# Patient Record
Sex: Male | Born: 1960 | Race: White | Hispanic: No | Marital: Married | Smoking: Current every day smoker
Health system: Southern US, Community
[De-identification: ages and names within clinical notes are randomized; demographics above are authoritative.]

## PROBLEM LIST (undated history)

## (undated) DIAGNOSIS — I10 Essential (primary) hypertension: Secondary | ICD-10-CM

## (undated) DIAGNOSIS — M51369 Other intervertebral disc degeneration, lumbar region without mention of lumbar back pain or lower extremity pain: Secondary | ICD-10-CM

## (undated) DIAGNOSIS — J439 Emphysema, unspecified: Secondary | ICD-10-CM

## (undated) DIAGNOSIS — G4730 Sleep apnea, unspecified: Secondary | ICD-10-CM

## (undated) DIAGNOSIS — F32A Depression, unspecified: Secondary | ICD-10-CM

## (undated) DIAGNOSIS — F329 Major depressive disorder, single episode, unspecified: Secondary | ICD-10-CM

## (undated) DIAGNOSIS — M5136 Other intervertebral disc degeneration, lumbar region: Secondary | ICD-10-CM

## (undated) DIAGNOSIS — I509 Heart failure, unspecified: Secondary | ICD-10-CM

## (undated) DIAGNOSIS — J449 Chronic obstructive pulmonary disease, unspecified: Secondary | ICD-10-CM

## (undated) DIAGNOSIS — C801 Malignant (primary) neoplasm, unspecified: Secondary | ICD-10-CM

## (undated) HISTORY — PX: CORONARY ANGIOPLASTY WITH STENT PLACEMENT: SHX49

## (undated) HISTORY — DX: Major depressive disorder, single episode, unspecified: F32.9

## (undated) HISTORY — DX: Depression, unspecified: F32.A

## (undated) HISTORY — DX: Emphysema, unspecified: J43.9

## (undated) HISTORY — PX: PALATE / UVULA BIOPSY / EXCISION: SUR128

## (undated) HISTORY — PX: PROSTATE SURGERY: SHX751

## (undated) HISTORY — PX: SHOULDER SURGERY: SHX246

## (undated) HISTORY — PX: THROAT SURGERY: SHX803

## (undated) HISTORY — PX: APPENDECTOMY: SHX54

---

## 2004-01-07 ENCOUNTER — Ambulatory Visit: Payer: Self-pay | Admitting: Urology

## 2004-01-15 ENCOUNTER — Other Ambulatory Visit: Payer: Self-pay

## 2004-01-21 ENCOUNTER — Inpatient Hospital Stay: Payer: Self-pay | Admitting: Urology

## 2004-04-04 ENCOUNTER — Ambulatory Visit: Payer: Self-pay | Admitting: Unknown Physician Specialty

## 2004-04-14 ENCOUNTER — Ambulatory Visit: Payer: Self-pay | Admitting: Unknown Physician Specialty

## 2004-10-18 ENCOUNTER — Emergency Department: Payer: Self-pay | Admitting: Emergency Medicine

## 2005-02-22 ENCOUNTER — Emergency Department: Payer: Self-pay | Admitting: Emergency Medicine

## 2005-09-11 ENCOUNTER — Other Ambulatory Visit: Payer: Self-pay

## 2005-09-29 ENCOUNTER — Other Ambulatory Visit: Payer: Self-pay

## 2005-09-29 ENCOUNTER — Inpatient Hospital Stay: Payer: Self-pay | Admitting: Cardiology

## 2005-10-15 ENCOUNTER — Encounter: Payer: Self-pay | Admitting: Cardiology

## 2005-10-24 ENCOUNTER — Encounter: Payer: Self-pay | Admitting: Cardiology

## 2005-11-23 ENCOUNTER — Encounter: Payer: Self-pay | Admitting: Cardiology

## 2005-11-30 ENCOUNTER — Emergency Department: Payer: Self-pay | Admitting: Unknown Physician Specialty

## 2005-12-24 ENCOUNTER — Encounter: Payer: Self-pay | Admitting: Cardiology

## 2006-01-17 ENCOUNTER — Emergency Department: Payer: Self-pay | Admitting: Emergency Medicine

## 2006-01-23 ENCOUNTER — Encounter: Payer: Self-pay | Admitting: Cardiology

## 2006-02-23 ENCOUNTER — Encounter: Payer: Self-pay | Admitting: Cardiology

## 2007-03-14 ENCOUNTER — Ambulatory Visit: Payer: Self-pay | Admitting: Urology

## 2007-07-05 ENCOUNTER — Ambulatory Visit: Payer: Self-pay | Admitting: Family Medicine

## 2007-07-25 ENCOUNTER — Ambulatory Visit: Payer: Self-pay | Admitting: Family Medicine

## 2007-08-24 ENCOUNTER — Ambulatory Visit: Payer: Self-pay | Admitting: Family Medicine

## 2007-09-24 ENCOUNTER — Ambulatory Visit: Payer: Self-pay | Admitting: Family Medicine

## 2007-11-30 ENCOUNTER — Emergency Department: Payer: Self-pay | Admitting: Emergency Medicine

## 2008-09-26 ENCOUNTER — Ambulatory Visit: Payer: Self-pay | Admitting: Orthopedic Surgery

## 2008-10-29 ENCOUNTER — Ambulatory Visit: Payer: Self-pay | Admitting: Neurosurgery

## 2008-12-03 ENCOUNTER — Emergency Department: Payer: Self-pay | Admitting: Emergency Medicine

## 2009-08-06 ENCOUNTER — Encounter: Payer: Self-pay | Admitting: Cardiovascular Disease

## 2009-08-23 ENCOUNTER — Encounter: Payer: Self-pay | Admitting: Cardiovascular Disease

## 2009-09-23 ENCOUNTER — Encounter: Payer: Self-pay | Admitting: Cardiovascular Disease

## 2009-10-24 ENCOUNTER — Encounter: Payer: Self-pay | Admitting: Cardiovascular Disease

## 2009-11-23 ENCOUNTER — Encounter: Payer: Self-pay | Admitting: Cardiovascular Disease

## 2009-11-28 ENCOUNTER — Encounter: Payer: Self-pay | Admitting: Anesthesiology

## 2010-11-16 ENCOUNTER — Emergency Department: Payer: Self-pay | Admitting: *Deleted

## 2011-05-22 ENCOUNTER — Emergency Department: Payer: Self-pay | Admitting: Emergency Medicine

## 2011-05-22 LAB — URINALYSIS, COMPLETE
Glucose,UR: NEGATIVE mg/dL (ref 0–75)
Ketone: NEGATIVE
Nitrite: NEGATIVE
Ph: 6 (ref 4.5–8.0)
Protein: NEGATIVE
RBC,UR: 8 /HPF (ref 0–5)
Specific Gravity: 1.024 (ref 1.003–1.030)
Squamous Epithelial: <1
WBC UR: 3 /HPF (ref 0–5)

## 2011-05-22 LAB — CBC WITH DIFFERENTIAL/PLATELET
Basophil #: 0 10*3/uL (ref 0.0–0.1)
Eosinophil #: 0.1 10*3/uL (ref 0.0–0.7)
Eosinophil %: 0.6 %
HCT: 51.3 % (ref 40.0–52.0)
HGB: 17 g/dL (ref 13.0–18.0)
Lymphocyte #: 2.2 10*3/uL (ref 1.0–3.6)
Lymphocyte %: 23.6 %
Monocyte #: 0.9 10*3/uL — ABNORMAL HIGH (ref 0.0–0.7)
Neutrophil #: 6.2 10*3/uL (ref 1.4–6.5)
Neutrophil %: 66.4 %
Platelet: 161 10*3/uL (ref 150–440)
RDW: 13.3 % (ref 11.5–14.5)
WBC: 9.3 10*3/uL (ref 3.8–10.6)

## 2011-05-22 LAB — COMPREHENSIVE METABOLIC PANEL
Albumin: 4.2 g/dL (ref 3.4–5.0)
Alkaline Phosphatase: 112 U/L (ref 50–136)
Anion Gap: 8 (ref 7–16)
BUN: 16 mg/dL (ref 7–18)
Bilirubin,Total: 0.8 mg/dL (ref 0.2–1.0)
Calcium, Total: 9.3 mg/dL (ref 8.5–10.1)
Chloride: 106 mmol/L (ref 98–107)
Creatinine: 0.78 mg/dL (ref 0.60–1.30)
EGFR (African American): >60
EGFR (Non-African Amer.): >60
Glucose: 89 mg/dL (ref 65–99)
Osmolality: 286 (ref 275–301)
Potassium: 4.4 mmol/L (ref 3.5–5.1)
Sodium: 143 mmol/L (ref 136–145)
Total Protein: 7.9 g/dL (ref 6.4–8.2)

## 2011-08-24 DIAGNOSIS — N529 Male erectile dysfunction, unspecified: Secondary | ICD-10-CM | POA: Insufficient documentation

## 2012-11-22 ENCOUNTER — Emergency Department: Payer: Self-pay | Admitting: Emergency Medicine

## 2013-01-06 ENCOUNTER — Ambulatory Visit: Payer: Self-pay | Admitting: Unknown Physician Specialty

## 2013-01-09 LAB — PATHOLOGY REPORT

## 2013-03-09 DIAGNOSIS — G894 Chronic pain syndrome: Secondary | ICD-10-CM | POA: Insufficient documentation

## 2013-08-30 ENCOUNTER — Emergency Department: Payer: Self-pay | Admitting: Emergency Medicine

## 2013-08-30 LAB — COMPREHENSIVE METABOLIC PANEL
ALT: 29 U/L (ref 12–78)
Albumin: 3.5 g/dL (ref 3.4–5.0)
Alkaline Phosphatase: 93 U/L
Anion Gap: 9 (ref 7–16)
BILIRUBIN TOTAL: 0.5 mg/dL (ref 0.2–1.0)
BUN: 16 mg/dL (ref 7–18)
Calcium, Total: 8.9 mg/dL (ref 8.5–10.1)
Chloride: 103 mmol/L (ref 98–107)
Co2: 29 mmol/L (ref 21–32)
Creatinine: 0.83 mg/dL (ref 0.60–1.30)
EGFR (Non-African Amer.): >60
Glucose: 112 mg/dL — ABNORMAL HIGH (ref 65–99)
OSMOLALITY: 283 (ref 275–301)
Potassium: 4 mmol/L (ref 3.5–5.1)
SGOT(AST): 20 U/L (ref 15–37)
SODIUM: 141 mmol/L (ref 136–145)
TOTAL PROTEIN: 7.2 g/dL (ref 6.4–8.2)

## 2013-08-30 LAB — CBC
HCT: 46 % (ref 40.0–52.0)
HGB: 14.8 g/dL (ref 13.0–18.0)
MCH: 30.3 pg (ref 26.0–34.0)
MCHC: 32.3 g/dL (ref 32.0–36.0)
MCV: 94 fL (ref 80–100)
PLATELETS: 167 10*3/uL (ref 150–440)
RBC: 4.91 10*6/uL (ref 4.40–5.90)
RDW: 14.1 % (ref 11.5–14.5)
WBC: 8.3 10*3/uL (ref 3.8–10.6)

## 2013-08-30 LAB — TROPONIN I: Troponin-I: <0.02 ng/mL

## 2013-12-29 LAB — BASIC METABOLIC PANEL
Anion Gap: 7 (ref 7–16)
BUN: 14 mg/dL (ref 7–18)
CO2: 31 mmol/L (ref 21–32)
CREATININE: 0.78 mg/dL (ref 0.60–1.30)
Calcium, Total: 8.3 mg/dL — ABNORMAL LOW (ref 8.5–10.1)
Chloride: 105 mmol/L (ref 98–107)
Glucose: 112 mg/dL — ABNORMAL HIGH (ref 65–99)
Osmolality: 286 (ref 275–301)
Potassium: 3.8 mmol/L (ref 3.5–5.1)
SODIUM: 143 mmol/L (ref 136–145)

## 2013-12-29 LAB — CBC
HCT: 47 % (ref 40.0–52.0)
HGB: 15.6 g/dL (ref 13.0–18.0)
MCH: 31.1 pg (ref 26.0–34.0)
MCHC: 33.1 g/dL (ref 32.0–36.0)
MCV: 94 fL (ref 80–100)
PLATELETS: 167 10*3/uL (ref 150–440)
RBC: 5.01 10*6/uL (ref 4.40–5.90)
RDW: 13.8 % (ref 11.5–14.5)
WBC: 10.2 10*3/uL (ref 3.8–10.6)

## 2013-12-29 LAB — PRO B NATRIURETIC PEPTIDE: B-Type Natriuretic Peptide: 4537 pg/mL — ABNORMAL HIGH (ref 0–125)

## 2013-12-29 LAB — TROPONIN I: Troponin-I: <0.02 ng/mL

## 2013-12-30 ENCOUNTER — Inpatient Hospital Stay: Payer: Self-pay | Admitting: Internal Medicine

## 2013-12-30 LAB — CK-MB
CK-MB: 0.7 ng/mL (ref 0.5–3.6)
CK-MB: <0.5 ng/mL — ABNORMAL LOW (ref 0.5–3.6)
CK-MB: <0.5 ng/mL — ABNORMAL LOW (ref 0.5–3.6)

## 2013-12-30 LAB — TROPONIN I
Troponin-I: <0.02 ng/mL
Troponin-I: <0.02 ng/mL
Troponin-I: <0.02 ng/mL

## 2014-06-16 NOTE — H&P (Signed)
PATIENT NAME:  Alejandro Cooley, DELDUCA MR#:  [id number] DATE OF BIRTH:  [birth year]  DATE OF ADMISSION:  12/30/2013  REFERRING PHYSICIAN: Loney Hering, MD  PRIMARY CARE PHYSICIAN: Valera Castle, MD  ADMISSION DIAGNOSIS: Congestive heart failure exacerbation.   HISTORY OF PRESENT ILLNESS: This is a 54 year old Caucasian male who presents to the Emergency Department complaining of shortness of breath x 2 days. The patient admits not taking his diuretics because it had been inconvenient going to and from the restroom to urinate with his new work schedule. The patient admits that his job is more strenuous and that he does not have as much free time or discretion with his time management as he did in his previous job. The patient saw his primary care doctor today and received a nebulizer treatment for shortness of breath, but it did not help. He was on his way to the pharmacy to pick up a prescription for inhalers when he decided that he should come to the Emergency Department for evaluation. In the Emergency Department the patient was found to have pulmonary vascular congestion on his chest x-ray, which prompted Emergency Department to call for admission.   REVIEW OF SYSTEMS:  CONSTITUTIONAL: The patient denies fever or weakness.  EYES: Denies blurred vision or inflammation.  EARS, NOSE, AND THROAT: Denies tinnitus or difficulty swallowing.  RESPIRATORY: Denies cough, but admits to shortness of breath.  CARDIOVASCULAR: The patient denies chest pain, but admits to dyspnea on exertion and some mild orthopnea, but he denies paroxysmal nocturnal dyspnea.  GASTROINTESTINAL: Denies nausea, vomiting, diarrhea.  GENITOURINARY: Denies dysuria, increased frequency, or hesitancy.  ENDOCRINE: Denies polyuria or polydipsia.  HEMATOLOGIC AND LYMPHATIC: Denies easy bruising or bleeding.  INTEGUMENTARY: Denies rashes or lesions.  MUSCULOSKELETAL: Denies myalgias or arthralgias.  NEUROLOGIC: Denies numbness  in his extremities or dysarthria, but admits to headaches now that he has nitroglycerin paste on his chest.  PSYCHIATRIC: The patient denies depression or suicidal ideation.   PAST MEDICAL HISTORY: Congestive heart failure, prostate cancer, hypertension, obstructive sleep apnea, and seasonal allergies.   PAST SURGICAL HISTORY: Prostate resection, palatoplasty, appendectomy, left shoulder repair, ingrown toenail removal, cervical diskectomy as well as stabilization, tonsillectomy, and adenoidectomy, as well as a septoplasty.   FAMILY HISTORY: The patient's father is deceased of a brain tumor. His mother has had adnexal cancer and is still living. There are multiple family members with coronary artery disease.   SOCIAL HISTORY: The patient denies alcohol or drug use. He quit smoking 1 year ago.   MEDICATIONS:  1.  Aspirin 81 mg 1 tab p.o. daily.  2.  Coreg, dose unspecified, 1 tab p.o. b.i.d.  3.  Crestor 40 mg 1 tab p.o. daily at bedtime.  4.  Gabapentin 300 mg 1 capsule p.o. t.i.d.  5.  Hydrochlorothiazide,  dose unspecified, 1 tab p.o. daily.  6.  Klonopin 1 mg 1 tablet p.o. daily as needed for anxiety.  7.  Montelukast, dose unspecified, 1 tab p.o. daily.  8.  Ramipril 10 mg 1 capsule p.o. b.i.d.  9.  Tramadol, dose unspecified, 1 tab p.o. t.i.d. as needed for pain.   ALLERGIES: No known drug allergies.   PERTINENT LABORATORY RESULTS AND RADIOGRAPHIC FINDINGS: Serum glucose is 112, BUN 14, creatinine 0.78, serum sodium 143, potassium 3.8, chloride 105, bicarbonate 31, calcium 8.3. Troponin is negative. White blood cell count is 10.2, hemoglobin 15.6, hematocrit 47, platelet count 167,000. BNP is 4537. Chest x-ray shows congestive heart failure with interstitial edema and small effusions.  PHYSICAL EXAMINATION:  VITAL SIGNS: Temperature is 98.6, pulse 93, respirations 22 with gradual decrease to 18, blood pressure 182/118 and then 185/63 after application of nitroglycerin paste.   GENERAL: The patient is alert and oriented x 3, in no apparent distress.  HEENT: Normocephalic, atraumatic. Pupils equal, round, and reactive to light and accommodation. Extraocular movements are intact. Mucous membranes are moist.  NECK: Trachea is midline. No adenopathy.  CHEST: Symmetric and atraumatic.  CARDIOVASCULAR: Regular rate and rhythm. Normal S1, S2. No rubs, clicks, or murmurs appreciated.  LUNGS: Faint crackles at the bases bilaterally with normal effort and excursion. The patient is wearing nasal cannula.  ABDOMEN: Positive bowel sounds. Soft, nontender, nondistended. No hepatosplenomegaly.  GENITOURINARY: Deferred.  MUSCULOSKELETAL: The patient moves all 4 extremities equally. Strength is 5/5 in upper and lower extremities bilaterally.  SKIN: No rashes or lesions.  EXTREMITIES: No clubbing, cyanosis, or edema.  NEUROLOGIC: Cranial nerves II through XII are grossly intact.  PSYCHIATRIC: Mood is normal. Affect is congruent.   ASSESSMENT AND PLAN: This is a 54 year old male with of acute on chronic congestive heart failure.  1.  Congestive heart failure: Exacerbation appears to be of systolic heart failure. The patient admits to noncompliance with his diuretic therapy. Furthermore, he has been more active at work, which presumably has placed more stress on his heart and has led to more vascular congestion. In the Emergency Department the patient received 40 mg of intravenous Lasix and reports feeling much better. I have scheduled 20 mg of intravenous Lasix for the morning and we will resume his home dosing of hydrochlorothiazide the following day. At this time, the dose of his beta blocker is unknown. Due to the acute setting of congestive heart failure exacerbation I have held his beta blocker at this time, but we will resume in 24 hour once the pharmacy can verify his dose.  2.  Hypertension: Continue ramipril.  3.  Obstructive sleep apnea: The patient is on continuous positive  airway pressure at home and will resume his home while he is in the hospital.  4.  Prostate cancer, stable.  5.  Seasonal allergies: While the patient is receiving intravenous Lasix we will not do any antihistamines, but we may add this to his regimen if needed. He may resume his leukotriene inhibitor once we verify the dose of this medicine.  6.  Obesity: Body mass index is 40.5. I have encouraged a balanced diet, portion control, and limited salt intake.  7.  Deep vein thrombosis prophylaxis: Heparin.  8.  Gastrointestinal prophylaxis not needed.  9.  The patient is a full code.   TIME SPENT ON ADMISSION ORDERS AND PATIENT CARE: Approximately 35 minutes.    ____________________________ Norva Riffle. Marcille Blanco, MD msd:ts D: 12/30/2013 03:41:40 ET T: 12/30/2013 04:21:53 ET JOB#: [id number]  cc: Norva Riffle. Marcille Blanco, MD, <Dictator> Norva Riffle Alexanderjames Berg MD ELECTRONICALLY SIGNED 01/01/2014 0:12

## 2014-06-16 NOTE — Discharge Summary (Signed)
Dates of Admission and Diagnosis:  Date of Admission 30-Dec-2013   Date of Discharge 30-Dec-2013   Admitting Diagnosis Congestive heart failure exacerbation.   Final Diagnosis Congestive heart failure exacerbation Hypertension Sleep apnea Obasity    Chief Complaint/History of Present Illness HISTORY OF PRESENT ILLNESS: This is a 54 year old Caucasian male who presents to the Emergency Department complaining of shortness of breath x 2 days. The patient admits not taking his diuretics because it had been inconvenient going to and from the restroom to urinate with his new work schedule. The patient admits that his job is more strenuous and that he does not have as much free time or discretion with his time management as he did in his previous job. The patient saw his primary care doctor today and received a nebulizer treatment for shortness of breath, but it did not help. He was on his way to the pharmacy to pick up a prescription for inhalers when he decided that he should come to the Emergency Department for evaluation. In the Emergency Department the patient was found to have pulmonary vascular congestion on his chest x-ray, which prompted Emergency Department to call for admission.   Allergies:  No Known Allergies:   Routine Chem:  06-Nov-15 21:39   Glucose, Serum  112  BUN 14  Creatinine (comp) 0.78  Sodium, Serum 143  Potassium, Serum 3.8  Chloride, Serum 105  CO2, Serum 31  Calcium (Total), Serum  8.3  Anion Gap 7  Osmolality (calc) 286  eGFR (African American) >60  eGFR (Non-African American) >60 (eGFR values <46m/min/1.73 m2 may be an indication of chronic kidney disease (CKD). Calculated eGFR, using the MRDR Study equation, is useful in  patients with stable renal function. The eGFR calculation will not be reliable in acutely ill patients when serum creatinine is changing rapidly. It is not useful in patients on dialysis. The eGFR calculation may not be applicable to  patients at the low and high extremes of body sizes, pregnant women, and vegetarians.)  B-Type Natriuretic Peptide ([facility]  4537 (Result(s) reported on 29 Dec 2013 at 10:09PM.)  Cardiac:  06-Nov-15 21:39   Troponin I < 0.02 (0.00-0.05 0.05 ng/mL or less: NEGATIVE  Repeat testing in 3-6 hrs  if clinically indicated. >0.05 ng/mL: POTENTIAL  MYOCARDIAL INJURY. Repeat  testing in 3-6 hrs if  clinically indicated. NOTE: An increase or decrease  of 30% or more on serial  testing suggests a  clinically important change)  07-Nov-15 06:34   CPK-MB, Serum 0.7 (Result(s) reported on 30 Dec 2013 at 07:14AM.)  Troponin I < 0.02 (0.00-0.05 0.05 ng/mL or less: NEGATIVE  Repeat testing in 3-6 hrs  if clinically indicated. >0.05 ng/mL: POTENTIAL  MYOCARDIAL INJURY. Repeat  testing in 3-6 hrs if  clinically indicated. NOTE: An increase or decrease  of 30% or more on serial  testing suggests a  clinically important change)    10:52   CPK-MB, Serum  < 0.5 (Result(s) reported on 30 Dec 2013 at 11:28AM.)  Troponin I < 0.02 (0.00-0.05 0.05 ng/mL or less: NEGATIVE  Repeat testing in 3-6 hrs  if clinically indicated. >0.05 ng/mL: POTENTIAL  MYOCARDIAL INJURY. Repeat  testing in 3-6 hrs if  clinically indicated. NOTE: An increase or decrease  of 30% or more on serial  testing suggests a  clinically important change)  Routine Hem:  06-Nov-15 21:39   WBC (CBC) 10.2  RBC (CBC) 5.01  Hemoglobin (CBC) 15.6  Hematocrit (CBC) 47.0  Platelet Count (CBC) 167 (Result(s)  reported on 29 Dec 2013 at 10:04PM.)  MCV 94  MCH 31.1  MCHC 33.1  RDW 13.8   PERTINENT RADIOLOGY STUDIES: XRay:    06-Nov-15 21:47, Chest PA and Lateral  Chest PA and Lateral   REASON FOR EXAM:    dyspnea  COMMENTS:       PROCEDURE: DXR - DXR CHEST PA (OR AP) AND LATERAL  - Dec 29 2013  9:47PM     CLINICAL DATA:  Pt c/o shortness of breath x 3 weeks intermittantly.  Pt states worse over past 2 days. Pt states he has not  be taking his  HCTZ x 1 month because he could not manage his urine output w/ new  job. Pt has hx of CHF and cardiac stents x 2. Pt is acutely short of  breath w/ generalized edema. Pt w/ audible wet lung sounds.    EXAM:  CHEST  2 VIEW    COMPARISON:  11/17/2010.  FINDINGS:  Lungs are hyperexpanded. There are diffusely thickened and irregular  interstitial markings. There are small bilateral effusions. More  confluent opacity is noted in both lung bases, likely atelectasis.  There is no pneumothorax.    Cardiac silhouette is mildly enlarged. No mediastinal or hilar  masses.    Bony thorax is demineralized but grossly intact.     IMPRESSION:  Congestive heart failure with interstitial edema and small  effusions.  Electronically Signed    By: Lajean Manes M.D.    On: 12/29/2013 21:51         Verified By: Lasandra Beech, M.D.,   Pertinent Past History:  Pertinent Past History Congestive heart failure, prostate cancer, hypertension, obstructive sleep apnea, and seasonal allergies.   Hospital Course:  Hospital Course a 54 year old male with of acute on chronic congestive heart failure.  1.  Congestive heart failure: Exacerbation appears to be of systolic heart failure.    Unknown EF at this time- may get from PMD or have to repeat Echo.   non compliant with lasix.   on iv lasix now, good diuresis ( 600 ml negative volume in 10 hrs).   requiring supplemental oxygen. 2.  Hypertension: Continue ramipril.  3.  Obstructive sleep apnea: The patient is on continuous positive airway pressure at home and will resume his home while he is in the hospital.  4.  Prostate cancer, stable.  5.  Seasonal allergies: no active issue currently.  6.  Obesity: Body mass index is 40.5. I have encouraged a balanced diet, portion control, and limited salt intake.  7.  Deep vein thrombosis prophylaxis: Heparin.   Condition on Discharge Stable   Code Status:  Code Status Full Code    DISCHARGE INSTRUCTIONS HOME MEDS:  Medication Reconciliation: Patient's Home Medications at Discharge:     Medication Instructions  coreg   orally 2 times a day   ramipril 10 mg oral capsule  1 cap(s) orally 2 times a day   gabapentin 300 mg oral capsule  1 cap(s) orally 3 times a day   montelukast   orally once a day   crestor 40 mg oral tablet  1 tab(s) orally once a day (at bedtime)   aspirin 81 mg oral tablet  1 tab(s) orally once a day   klonopin 1 mg oral tablet  1 milligram(s) orally , As Needed - for Agitation, for Anxiety, Nervousness    furosemide 20 mg oral tablet  1 tab(s) orally 2 times a day    STOP  TAKING THE FOLLOWING MEDICATION(S):    tramadol:  orally 3 times a day hydrochlorothiazide:  orally once a day  Physician's Instructions:  Home Oxygen? Yes   Oxygen delivery at home: 3L  Nasal Cannula   Diet Low Sodium  Low Fat, Low Cholesterol   Activity Limitations As tolerated   Return to Work Not Applicable   Time frame for Follow Up Appointment 1-2 days   Other Comments transfer to [facility] cardiology.   Electronic Signatures: Vaughan Basta (MD)  (Signed [phone] 15:10)  Authored: ADMISSION DATE AND DIAGNOSIS, CHIEF COMPLAINT/HPI, Allergies, PERTINENT LABS, PERTINENT RADIOLOGY STUDIES, PERTINENT PAST HISTORY, HOSPITAL COURSE, DISCHARGE INSTRUCTIONS HOME MEDS, PATIENT INSTRUCTIONS   Last Updated: 07-Nov-15 15:10 by Vaughan Basta (MD)

## 2014-08-31 ENCOUNTER — Other Ambulatory Visit: Payer: Self-pay | Admitting: Pain Medicine

## 2014-09-30 ENCOUNTER — Emergency Department: Payer: Federal, State, Local not specified - PPO

## 2014-09-30 ENCOUNTER — Encounter: Payer: Self-pay | Admitting: Emergency Medicine

## 2014-09-30 ENCOUNTER — Emergency Department
Admission: EM | Admit: 2014-09-30 | Discharge: 2014-09-30 | Disposition: A | Discharge status: Home / Self Care | Payer: Federal, State, Local not specified - PPO | Attending: Student | Admitting: Student

## 2014-09-30 DIAGNOSIS — J029 Acute pharyngitis, unspecified: Secondary | ICD-10-CM | POA: Diagnosis not present

## 2014-09-30 DIAGNOSIS — Z720 Tobacco use: Secondary | ICD-10-CM | POA: Diagnosis not present

## 2014-09-30 DIAGNOSIS — R590 Localized enlarged lymph nodes: Secondary | ICD-10-CM

## 2014-09-30 DIAGNOSIS — I10 Essential (primary) hypertension: Secondary | ICD-10-CM | POA: Insufficient documentation

## 2014-09-30 HISTORY — DX: Essential (primary) hypertension: I10

## 2014-09-30 HISTORY — DX: Heart failure, unspecified: I50.9

## 2014-09-30 HISTORY — DX: Malignant (primary) neoplasm, unspecified: C80.1

## 2014-09-30 HISTORY — DX: Sleep apnea, unspecified: G47.30

## 2014-09-30 HISTORY — DX: Chronic obstructive pulmonary disease, unspecified: J44.9

## 2014-09-30 HISTORY — DX: Other intervertebral disc degeneration, lumbar region: M51.36

## 2014-09-30 HISTORY — DX: Other intervertebral disc degeneration, lumbar region without mention of lumbar back pain or lower extremity pain: M51.369

## 2014-09-30 LAB — CBC WITH DIFFERENTIAL/PLATELET
Basophils Absolute: 0 10*3/uL (ref 0–0.1)
Basophils Relative: 0 %
Eosinophils Absolute: 0 10*3/uL (ref 0–0.7)
Eosinophils Relative: 1 %
HEMATOCRIT: 48.3 % (ref 40.0–52.0)
Hemoglobin: 16.4 g/dL (ref 13.0–18.0)
LYMPHS ABS: 1.8 10*3/uL (ref 1.0–3.6)
Lymphocytes Relative: 24 %
MCH: 31.9 pg (ref 26.0–34.0)
MCHC: 33.9 g/dL (ref 32.0–36.0)
MCV: 94.1 fL (ref 80.0–100.0)
Monocytes Absolute: 0.8 10*3/uL (ref 0.2–1.0)
Monocytes Relative: 10 %
Neutro Abs: 5.1 10*3/uL (ref 1.4–6.5)
Neutrophils Relative %: 65 %
Platelets: 143 10*3/uL — ABNORMAL LOW (ref 150–440)
RBC: 5.13 MIL/uL (ref 4.40–5.90)
RDW: 14.1 % (ref 11.5–14.5)
WBC: 7.8 10*3/uL (ref 3.8–10.6)

## 2014-09-30 LAB — COMPREHENSIVE METABOLIC PANEL
ALT: 17 U/L (ref 17–63)
AST: 16 U/L (ref 15–41)
Albumin: 4 g/dL (ref 3.5–5.0)
Alkaline Phosphatase: 77 U/L (ref 38–126)
Anion gap: 8 (ref 5–15)
BUN: 20 mg/dL (ref 6–20)
CALCIUM: 9.6 mg/dL (ref 8.9–10.3)
CO2: 33 mmol/L — ABNORMAL HIGH (ref 22–32)
Chloride: 101 mmol/L (ref 101–111)
Creatinine, Ser: 0.99 mg/dL (ref 0.61–1.24)
GFR calc Af Amer: >60 mL/min (ref >60)
Glucose, Bld: 88 mg/dL (ref 65–99)
POTASSIUM: 4.5 mmol/L (ref 3.5–5.1)
Sodium: 142 mmol/L (ref 135–145)
Total Bilirubin: 0.8 mg/dL (ref 0.3–1.2)
Total Protein: 7.4 g/dL (ref 6.5–8.1)

## 2014-09-30 MED ORDER — MAGIC MOUTHWASH W/LIDOCAINE: 5.0000 mL | Freq: Four times a day (QID) | ORAL | Status: DC

## 2014-09-30 MED ORDER — IOHEXOL 300 MG/ML  SOLN
75.0000 mL | Freq: Once | INTRAMUSCULAR | Status: AC | PRN
  Administered 2014-09-30: 75 mL via INTRAVENOUS

## 2014-09-30 MED ORDER — NAPROXEN 500 MG PO TABS: 500.0000 mg | ORAL_TABLET | Freq: Two times a day (BID) | ORAL | Status: DC

## 2014-09-30 NOTE — ED Notes (Signed)
Pt presents to ED with swelling on right side of neck, face along jaw, and ear which started yesterday. Pt reports that the swelling had decreased some but then increased today. Denies sore throat or illness.  Pt states his throat does hurt upon swallowing. Denies known fever.

## 2014-09-30 NOTE — ED Provider Notes (Signed)
[facility] Emergency Department Provider Note  ____________________________________________  Time seen: Approximately 3:04 PM  I have reviewed the triage vital signs and the nursing notes.   HISTORY  Chief Complaint Lymphadenopathy    HPI Alejandro Cooley is a 54 y.o. male patient complaining of swelling the right side of his neck states this. I'll start him on jawline radiates up to the ear.Patient say onset of sore throat today. Patient state the swelling decrease increases. Patient stated there is some mild dysplasia. Patient denies any URI signs and symptoms. Patient rates his pain discomfort as a 4/10. Describes the pain as dull increased sharp or swallowing. Patient states 8 to tolerate fluids but had difficulty with solid foods. No palliative measures taken for this complaint.  Past Medical History  Diagnosis Date  . Cancer     prostate  . CHF (congestive heart failure)   . Sleep apnea   . DDD (degenerative disc disease), lumbar   . Hypertension   . COPD (chronic obstructive pulmonary disease)     There are no active problems to display for this patient.   Past Surgical History  Procedure Laterality Date  . Palate / uvula biopsy / excision    . Appendectomy    . Prostate surgery    . Coronary angioplasty with stent placement    . Throat surgery    . Shoulder surgery      No current outpatient prescriptions on file.  Allergies Review of patient's allergies indicates no known allergies.  No family history on file.  Social History History  Substance Use Topics  . Smoking status: Current Every Day Smoker    Types: Cigarettes  . Smokeless tobacco: Not on file  . Alcohol Use: No    Review of Systems Constitutional: No fever/chills Eyes: No visual changes. ENT: Sore throat Cardiovascular: Denies chest pain. Respiratory: Denies shortness of breath. Gastrointestinal: No abdominal pain.  No nausea, no vomiting.  No diarrhea.  No  constipation. Genitourinary: Negative for dysuria. Musculoskeletal: Negative for back pain. Skin: Negative for rash. Neurological: Negative for headaches, focal weakness or numbness. Endocrine:Hypertension Hematological/Lymphatic: Right cervical adenopathy 10-point ROS otherwise negative.  ____________________________________________   PHYSICAL EXAM:  VITAL SIGNS: ED Triage Vitals  Enc Vitals Group     BP 09/30/14 1353 118/58 mmHg     Pulse Rate 09/30/14 1353 79     Resp 09/30/14 1353 16     Temp 09/30/14 1353 97.8 F (36.6 C)     Temp Source 09/30/14 1353 Oral     SpO2 09/30/14 1353 90 %     Weight 09/30/14 1353 270 lb (122.471 kg)     Height 09/30/14 1353 5\' 11"  (1.803 m)     Head Cir --      Peak Flow --      Pain Score 09/30/14 1355 4     Pain Loc --      Pain Edu? --      Excl. in [place]? --     Constitutional: Alert and oriented. Well appearing and in no acute distress. Eyes: Conjunctivae are normal. PERRL. EOMI. Head: Atraumatic. Nose: No congestion/rhinnorhea. Mouth/Throat: Mucous membranes are moist.  Oropharynx erythematous. Neck: No stridor.  No cervical spine tenderness to palpation. Hematological/Lymphatic/Immunilogical: Right cervical lymphadenopathy. Cardiovascular: Normal rate, regular rhythm. Grossly normal heart sounds.  Good peripheral circulation. Respiratory: Normal respiratory effort.  No retractions. Lungs CTAB. Gastrointestinal: Soft and nontender. No distention. No abdominal bruits. No CVA tenderness. Musculoskeletal: No lower extremity tenderness  nor edema.  No joint effusions. Neurologic:  Normal speech and language. No gross focal neurologic deficits are appreciated. No gait instability. Skin:  Skin is warm, dry and intact. No rash noted. Psychiatric: Mood and affect are normal. Speech and behavior are normal.  ____________________________________________   LABS (all labs ordered are listed, but only abnormal results are displayed)  Labs  Reviewed  COMPREHENSIVE METABOLIC PANEL - Abnormal; Notable for the following:    CO2 33 (*)    All other components within normal limits  CBC WITH DIFFERENTIAL/PLATELET - Abnormal; Notable for the following:    Platelets 143 (*)    All other components within normal limits  CULTURE, GROUP A STREP ([facility] ONLY)   ____________________________________________  EKG   ____________________________________________  RADIOLOGY contrast cervical soft tissue CT reveal mild information the right submandibular gland without evidence of abscess. ____________________________________________   PROCEDURES  Procedure(s) performed: None  Critical Care performed: No  ____________________________________________   INITIAL IMPRESSION / ASSESSMENT AND PLAN / ED COURSE  Pertinent labs & imaging results that were available during my care of the patient were reviewed by me and considered in my medical decision making (see chart for details). Inflammation of the right submandibular gland without abscess. Discuss CT results with patient. Advised antibodies and appropriate this time. This patient follow-up with the family doctor in 3-5 days. Patient given prescription for Magic mouthwash for sore throat. Patient advised to take naproxen as directed for 5 days. Patient determined ER if condition worsens.   FINAL CLINICAL IMPRESSION(S) / ED DIAGNOSES  Final diagnoses:  Adenopathy, cervical  Acute pharyngitis, unspecified pharyngitis type      Sable Feil, PA-C 09/30/14 1715  Joanne Gavel, MD 09/30/14 2347

## 2014-09-30 NOTE — ED Notes (Addendum)
Pt reports swelling to right side of neck x1 day now, denies any dental issues. Pt reports pain to right ear as well. Pt's oxygen saturation on room air is 90%, pt reports normal levels are in the high 80s.

## 2014-10-03 LAB — CULTURE, GROUP A STREP (THRC)

## 2015-01-11 ENCOUNTER — Other Ambulatory Visit: Payer: Self-pay | Admitting: Specialist

## 2015-01-11 DIAGNOSIS — R911 Solitary pulmonary nodule: Secondary | ICD-10-CM

## 2015-01-21 ENCOUNTER — Ambulatory Visit
Admission: RE | Admit: 2015-01-21 | Discharge: 2015-01-21 | Disposition: A | Discharge status: Home / Self Care | Payer: Federal, State, Local not specified - PPO | Source: Ambulatory Visit | Attending: Specialist | Admitting: Specialist

## 2015-01-21 DIAGNOSIS — R911 Solitary pulmonary nodule: Secondary | ICD-10-CM | POA: Diagnosis not present

## 2015-01-21 DIAGNOSIS — J439 Emphysema, unspecified: Secondary | ICD-10-CM | POA: Diagnosis not present

## 2015-01-24 ENCOUNTER — Encounter: Payer: Self-pay | Admitting: Cardiothoracic Surgery

## 2015-01-24 ENCOUNTER — Inpatient Hospital Stay: Payer: Federal, State, Local not specified - PPO | Attending: Cardiothoracic Surgery | Admitting: Cardiothoracic Surgery

## 2015-01-24 ENCOUNTER — Encounter: Payer: Self-pay | Admitting: *Deleted

## 2015-01-24 VITALS — BP 111/74 | HR 73 | Temp 98.2°F | Ht 71.0 in | Wt 288.9 lb

## 2015-01-24 DIAGNOSIS — Z8546 Personal history of malignant neoplasm of prostate: Secondary | ICD-10-CM | POA: Insufficient documentation

## 2015-01-24 DIAGNOSIS — R918 Other nonspecific abnormal finding of lung field: Secondary | ICD-10-CM | POA: Diagnosis not present

## 2015-01-24 DIAGNOSIS — J449 Chronic obstructive pulmonary disease, unspecified: Secondary | ICD-10-CM | POA: Insufficient documentation

## 2015-01-24 NOTE — Progress Notes (Signed)
Patient ID: Alejandro Cooley, male   DOB: January 25, 1961, 54 y.o.   MRN: [id number]  Chief Complaint  Patient presents with  . Lung Mass    referral from [place]    Referred By Dr. Raul Del Reason for Referral right lower lobe mass  HPI Location, Quality, Duration, Severity, Timing, Context, Modifying Factors, Associated Signs and Symptoms.  Alejandro Cooley is a 54 y.o. male.  He presented to Dr. Raul Del after referral for management of his COPD. He states that over the last several weeks he's been having increasing problems with shortness of breath and thought that his C Pap machine may be working inappropriately. For this reason he sought care with Dr. Raul Del who obtained a CT scan for baseline evaluation. Patient has received much of his care at [facility] which I will outline below. He states that his CT scan was abnormal showing a right lower lobe mass and he is sent here for further evaluation. He's been using a CPM machine for a long time. He states that he was intubated for over 5 weeks a couple years ago whenever he suffer complications from an anterior approach to the cervical spine. He states this became infected and he had a prolonged hospitalization at [facility]. Currently he is only able to walk 40-50 feet before he has to stop. He does smoke and smokes about a pack cigarettes a day. In addition he has had a significant weight gain and currently weighs approximately 300 pounds. He has not had any hemoptysis fevers or chills. He does carry a history of prostate cancer treated with radiation therapy and subsequent prostatectomy at at [facility] in 2006. He has a history of multiple cardiac stents in 2000 07/30/2008. He states that he had a cardiac catheter in 2015 which was "good" he didn't report a set of pulmonary function studies that were done in Dr. Gust Brooms office. This revealed an FEV1 of 1.436% of predicted. His DLCO was 61% of predicted. However corrected it was 114%.   Past  Medical History  Diagnosis Date  . Cancer [facility])     prostate  . CHF (congestive heart failure) ([place])   . Sleep apnea   . DDD (degenerative disc disease), lumbar   . Hypertension   . COPD (chronic obstructive pulmonary disease) ([place])   . Depression   . Emphysema of lung [facility])     Past Surgical History  Procedure Laterality Date  . Palate / uvula biopsy / excision    . Appendectomy    . Prostate surgery    . Coronary angioplasty with stent placement    . Throat surgery    . Shoulder surgery      No family history on file.  Social History Social History  Substance Use Topics  . Smoking status: Current Every Day Smoker    Types: Cigarettes  . Smokeless tobacco: None  . Alcohol Use: No    No Known Allergies  Current Outpatient Prescriptions  Medication Sig Dispense Refill  . albuterol (PROAIR HFA) 108 (90 BASE) MCG/ACT inhaler INHALE 2 PUFFS INTO THE LUNGS EVERY 4 HOURS AS NEEDED    . aspirin EC 81 MG tablet Take by mouth.    Marland Kitchen atorvastatin (LIPITOR) 80 MG tablet TAKE 1 TABLET BY MOUTH EVERY DAY    . carvedilol (COREG) 25 MG tablet Take by mouth.    . clonazePAM (KLONOPIN) 1 MG tablet TAKE 1 TABLET BY MOUTH AT BEDTIME AS NEEDED    . gabapentin (NEURONTIN) 300 MG  capsule Take by mouth.    . hydrALAZINE (APRESOLINE) 10 MG tablet Take by mouth.    . montelukast (SINGULAIR) 10 MG tablet Take 1 tablet by mouth every day. Needs appt for more refills    . nitroGLYCERIN (NITROSTAT) 0.4 MG SL tablet Place under the tongue.    . ramipril (ALTACE) 10 MG capsule Take by mouth.    . spironolactone (ALDACTONE) 25 MG tablet TAKE 1/2 TABLET(12.5 MG TOTAL) BY MOUTH ONCE DAILY.    Marland Kitchen tiotropium (SPIRIVA HANDIHALER) 18 MCG inhalation capsule Place into inhaler and inhale.    . torsemide (DEMADEX) 20 MG tablet Take 20 mg by mouth 2 (two) times daily.  11  . traMADol (ULTRAM) 50 MG tablet Take by mouth.    Marland Kitchen UNABLE TO FIND      No current facility-administered medications for this visit.       Review of Systems A complete review of systems was asked and was negative except for the following positive findings irregular heartbeat, shortness of breath, shortness of breath when lying flat, joint pain, joint stiffness, depression, anxiety.  Blood pressure 111/74, pulse 73, temperature 98.2 F (36.8 C), temperature source Oral, height 5\' 11"  (1.803 m), weight 288 lb 14.6 oz (131.05 kg), SpO2 88 %.  Physical Exam CONSTITUTIONAL:  Pleasant, well-developed, well-nourished, and in no acute distress. EYES: Pupils equal and reactive to light, Sclera non-icteric EARS, NOSE, MOUTH AND THROAT:  The oropharynx was clear.  Dentition is good repair.  Oral mucosa pink and moist.  Status post UPPP. LYMPH NODES:  Lymph nodes in the neck and axillae were normal RESPIRATORY:  Lungs were clear.  Normal respiratory effort without pathologic use of accessory muscles of respiration CARDIOVASCULAR: Heart was regular without murmurs.  There were no carotid bruits. GI: The abdomen was soft, nontender, and nondistended. There were no palpable masses. There was no hepatosplenomegaly. There were normal bowel sounds in all quadrants. GU:  Rectal deferred.   MUSCULOSKELETAL:  Normal muscle strength and tone.  No clubbing or cyanosis.   SKIN:  There were no pathologic skin lesions.  There were no nodules on palpation. NEUROLOGIC:  Sensation is normal.  Cranial nerves are grossly intact. PSYCH:  Oriented to person, place and time.  Mood and affect are normal.  Data Reviewed I have independently reviewed the patient's CT scan and compared it to a chest x-ray had made one year ago.  I have personally reviewed the patient's imaging, laboratory findings and medical records.    Assessment    There is a lobulated right lower lobe mass. Although this has the appearance of a benign lesion on CT scan the fact that it was not present on a chest x-ray year ago is concerning. Also his extensive smoking history and  his prior history of prostate cancer concerning as well. For this reason I believe we should proceed on with a PET scan. I had a long discussion with him regarding his overall poor health. Today his oxygen saturations were 88% when he arrived and increased to 92% after he waited in the exam room for 30 minutes. He states that this is typical for him to run in the high 80s.    Plan    I will obtain a PET scan and a probable right lower lobe lung biopsy. I will also ask our oncologist to see the patient for evaluation. I did discuss with him smoking cessation as well as weight reduction. I offered him a referral to our pulmonary rehabilitation  program. He currently is employed at [employer] and would like some more information on that. Mr. Melinda Crutch will obtain the information about her pulmonary rehabilitation program and relay this to the patient. We'll see him back again in one week.      Nestor Lewandowsky, MD 01/24/2015, 8:59 AM

## 2015-01-25 NOTE — Progress Notes (Signed)
  Oncology Nurse Navigator Documentation    Navigator Encounter Type: Clinic/MDC (01/25/15 0700) Patient Visit Type: Surgery (01/25/15 0700) Treatment Phase: Abnormal Scans (01/25/15 0700)                  Time Spent with Patient: 30 (01/25/15 0700)   Met with patient at initial thoracic surgery appointment. Introduced Programmer, multimedia and reviewed plan of care. Will follow.

## 2015-01-29 ENCOUNTER — Ambulatory Visit: Payer: Federal, State, Local not specified - PPO

## 2015-01-30 ENCOUNTER — Encounter
Admission: RE | Admit: 2015-01-30 | Discharge: 2015-01-30 | Disposition: A | Discharge status: Home / Self Care | Payer: Federal, State, Local not specified - PPO | Source: Ambulatory Visit | Attending: Cardiothoracic Surgery | Admitting: Cardiothoracic Surgery

## 2015-01-30 DIAGNOSIS — R918 Other nonspecific abnormal finding of lung field: Secondary | ICD-10-CM | POA: Insufficient documentation

## 2015-01-30 LAB — GLUCOSE, CAPILLARY: Glucose-Capillary: 116 mg/dL — ABNORMAL HIGH (ref 65–99)

## 2015-01-30 MED ORDER — FLUDEOXYGLUCOSE F - 18 (FDG) INJECTION
12.9700 | Freq: Once | INTRAVENOUS | Status: AC | PRN
  Administered 2015-01-30: 12.97 via INTRAVENOUS

## 2015-05-29 DIAGNOSIS — D352 Benign neoplasm of pituitary gland: Secondary | ICD-10-CM | POA: Insufficient documentation

## 2015-06-14 DIAGNOSIS — Z6841 Body Mass Index (BMI) 40.0 and over, adult: Secondary | ICD-10-CM | POA: Insufficient documentation

## 2015-12-18 ENCOUNTER — Encounter: Payer: Self-pay | Admitting: *Deleted

## 2015-12-18 ENCOUNTER — Emergency Department: Payer: Federal, State, Local not specified - PPO

## 2015-12-18 ENCOUNTER — Emergency Department
Admission: EM | Admit: 2015-12-18 | Discharge: 2015-12-18 | Disposition: A | Discharge status: Home / Self Care | Payer: Federal, State, Local not specified - PPO | Attending: Emergency Medicine | Admitting: Emergency Medicine

## 2015-12-18 DIAGNOSIS — M25551 Pain in right hip: Secondary | ICD-10-CM

## 2015-12-18 DIAGNOSIS — J449 Chronic obstructive pulmonary disease, unspecified: Secondary | ICD-10-CM | POA: Diagnosis not present

## 2015-12-18 DIAGNOSIS — Y999 Unspecified external cause status: Secondary | ICD-10-CM | POA: Insufficient documentation

## 2015-12-18 DIAGNOSIS — Y929 Unspecified place or not applicable: Secondary | ICD-10-CM | POA: Diagnosis not present

## 2015-12-18 DIAGNOSIS — Y939 Activity, unspecified: Secondary | ICD-10-CM | POA: Insufficient documentation

## 2015-12-18 DIAGNOSIS — Z8546 Personal history of malignant neoplasm of prostate: Secondary | ICD-10-CM | POA: Diagnosis not present

## 2015-12-18 DIAGNOSIS — M25561 Pain in right knee: Secondary | ICD-10-CM | POA: Insufficient documentation

## 2015-12-18 DIAGNOSIS — I110 Hypertensive heart disease with heart failure: Secondary | ICD-10-CM | POA: Insufficient documentation

## 2015-12-18 DIAGNOSIS — Z7982 Long term (current) use of aspirin: Secondary | ICD-10-CM | POA: Insufficient documentation

## 2015-12-18 DIAGNOSIS — W108XXA Fall (on) (from) other stairs and steps, initial encounter: Secondary | ICD-10-CM | POA: Diagnosis not present

## 2015-12-18 DIAGNOSIS — Z791 Long term (current) use of non-steroidal anti-inflammatories (NSAID): Secondary | ICD-10-CM | POA: Diagnosis not present

## 2015-12-18 DIAGNOSIS — I509 Heart failure, unspecified: Secondary | ICD-10-CM | POA: Insufficient documentation

## 2015-12-18 DIAGNOSIS — Z79899 Other long term (current) drug therapy: Secondary | ICD-10-CM | POA: Insufficient documentation

## 2015-12-18 DIAGNOSIS — F17210 Nicotine dependence, cigarettes, uncomplicated: Secondary | ICD-10-CM | POA: Diagnosis not present

## 2015-12-18 DIAGNOSIS — W19XXXA Unspecified fall, initial encounter: Secondary | ICD-10-CM

## 2015-12-18 MED ORDER — TRAMADOL HCL 50 MG PO TABS: 50.0000 mg | ORAL_TABLET | Freq: Four times a day (QID) | ORAL | 0 refills | Status: AC | PRN

## 2015-12-18 NOTE — Discharge Instructions (Signed)
Patient advised to ambulate with support of a cane. Wearing a knee immobilizer for 3-5 days while awake as needed.

## 2015-12-18 NOTE — ED Notes (Signed)
Pt in via triage with complaints of falling down a step last night, landing with "this leg back behind me" speaking of his right leg.  Pt reports hitting head on legs of bar stool.  Pt with complaints of pain to right knee and right hip.  Pt denies LOC.  Pt reports difficulty ambulating.  Pt in no immediate distress.

## 2015-12-18 NOTE — ED Provider Notes (Signed)
[facility] Emergency Department Provider Note   ____________________________________________   First MD Initiated Contact with Patient 12/18/15 1145     (approximate)  I have reviewed the triage vital signs and the nursing notes.   HISTORY  Chief Complaint Fall; Knee Pain; and Wrist Pain    HPI Alejandro Cooley is a 55 y.o. male patient complain of right hip and right knee pain secondary to a fall yesterday. Patient stated he tripped and fell coming down 3 steps yesterday. Patient had a previous fall 4 days ago affecting the same extremity. Patient is unsure if tripping or the hip is giving out when he falls. Patient stated he has noticed increased hip pain after undergoing radiation therapy for his prostate. Patient state eventually he had prostate surgery and no longer require radiation. Patient state is also was told he had degenerative. Disc disease. Past Medical History:  Diagnosis Date  . Cancer [facility]    prostate  . CHF (congestive heart failure) ([place])   . COPD (chronic obstructive pulmonary disease) ([place])   . DDD (degenerative disc disease), lumbar   . Depression   . Emphysema of lung ([place])   . Hypertension   . Sleep apnea     There are no active problems to display for this patient.   Past Surgical History:  Procedure Laterality Date  . APPENDECTOMY    . CORONARY ANGIOPLASTY WITH STENT PLACEMENT    . PALATE / UVULA BIOPSY / EXCISION    . PROSTATE SURGERY    . SHOULDER SURGERY    . THROAT SURGERY      Prior to Admission medications   Medication Sig Start Date End Date Taking? Authorizing Provider  albuterol (PROAIR HFA) 108 (90 BASE) MCG/ACT inhaler INHALE 2 PUFFS INTO THE LUNGS EVERY 4 HOURS AS NEEDED 10/30/14   Historical Provider, MD  aspirin EC 81 MG tablet Take by mouth.    Historical Provider, MD  atorvastatin (LIPITOR) 80 MG tablet TAKE 1 TABLET BY MOUTH EVERY DAY 01/12/15   Historical Provider, MD  carvedilol (COREG) 25 MG  tablet Take by mouth. 05/29/14   Historical Provider, MD  clonazePAM (KLONOPIN) 1 MG tablet TAKE 1 TABLET BY MOUTH AT BEDTIME AS NEEDED 09/19/13   Historical Provider, MD  gabapentin (NEURONTIN) 300 MG capsule Take by mouth. 12/14/14   Historical Provider, MD  hydrALAZINE (APRESOLINE) 10 MG tablet Take by mouth. 10/16/14 10/16/15  Historical Provider, MD  montelukast (SINGULAIR) 10 MG tablet Take 1 tablet by mouth every day. Needs appt for more refills 11/26/14   Historical Provider, MD  nitroGLYCERIN (NITROSTAT) 0.4 MG SL tablet Place under the tongue. 09/04/13   Historical Provider, MD  ramipril (ALTACE) 10 MG capsule Take by mouth. 02/19/14   Historical Provider, MD  spironolactone (ALDACTONE) 25 MG tablet TAKE 1/2 TABLET(12.5 MG TOTAL) BY MOUTH ONCE DAILY. 12/19/14 12/19/15  Historical Provider, MD  tiotropium (SPIRIVA HANDIHALER) 18 MCG inhalation capsule Place into inhaler and inhale. 01/10/15 01/10/16  Historical Provider, MD  torsemide (DEMADEX) 20 MG tablet Take 20 mg by mouth 2 (two) times daily. 12/11/14   Historical Provider, MD  traMADol Veatrice Bourbon) 50 MG tablet Take by mouth. 10/18/14   Historical Provider, MD  traMADol (ULTRAM) 50 MG tablet Take 1 tablet (50 mg total) by mouth every 6 (six) hours as needed. 12/18/15 12/17/16  Sable Feil, PA-C  UNABLE TO FIND     Historical Provider, MD    Allergies Review of patient's allergies indicates no  known allergies.  History reviewed. No pertinent family history.  Social History Social History  Substance Use Topics  . Smoking status: Current Every Day Smoker    Packs/day: 0.50    Types: Cigarettes  . Smokeless tobacco: Never Used  . Alcohol use No    Review of Systems Constitutional: No fever/chills Eyes: No visual changes. ENT: No sore throat. Cardiovascular: Denies chest pain. Respiratory: Denies shortness of breath. Gastrointestinal: No abdominal pain.  No nausea, no vomiting.  No diarrhea.  No constipation. Genitourinary:  Negative for dysuria. Musculoskeletal: Right hip and right knee pain  Skin: Negative for rash. Neurological: Negative for headaches, focal weakness or numbness. Psychiatric:Depression Endocrine:Hypertension   ____________________________________________   PHYSICAL EXAM:  VITAL SIGNS: ED Triage Vitals  Enc Vitals Group     BP 12/18/15 1106 119/68     Pulse Rate 12/18/15 1106 80     Resp 12/18/15 1106 16     Temp 12/18/15 1106 98.2 F (36.8 C)     Temp Source 12/18/15 1106 Oral     SpO2 12/18/15 1106 92 %     Weight 12/18/15 1107 290 lb (131.5 kg)     Height 12/18/15 1107 5\' 11"  (1.803 m)     Head Circumference --      Peak Flow --      Pain Score 12/18/15 1107 4     Pain Loc --      Pain Edu? --      Excl. in [place]? --     Constitutional: Alert and oriented. Well appearing and in no acute distress. Eyes: Conjunctivae are normal. PERRL. EOMI. Head: Atraumatic. Nose: No congestion/rhinnorhea. Mouth/Throat: Mucous membranes are moist.  Oropharynx non-erythematous. Neck: No stridor.  No cervical spine tenderness to palpation. Hematological/Lymphatic/Immunilogical: No cervical lymphadenopathy. Cardiovascular: Normal rate, regular rhythm. Grossly normal heart sounds.  Good peripheral circulation. Respiratory: Normal respiratory effort.  No retractions. Lungs CTAB. Gastrointestinal: Soft and nontender. No distention. No abdominal bruits. No CVA tenderness. Genitourinary:  Musculoskeletal:  Neurologic:  Normal speech and language. No gross focal neurologic deficits are appreciated. No gait instability. Skin:  Skin is warm, dry and intact. No rash noted. Psychiatric: Mood and affect are normal. Speech and behavior are normal.  ____________________________________________   LABS (all labs ordered are listed, but only abnormal results are displayed)  Labs Reviewed - No data to  display ____________________________________________  EKG   ____________________________________________  RADIOLOGY  No acute findings x-ray of the right hip and right knee. ____________________________________________   PROCEDURES  Procedure(s) performed: None  Procedures  Critical Care performed: No  ____________________________________________   INITIAL IMPRESSION / ASSESSMENT AND PLAN / ED COURSE  Pertinent labs & imaging results that were available during my care of the patient were reviewed by me and considered in my medical decision making (see chart for details).  Pain to the right hip and right knee secondary to fall. Discussed x-ray findings with palpation. Patient given discharge Instructions. Patient advised to follow-up with family doctor for continued care. Patient given a prescription for tramadol.  Clinical Course    placed in a knee immobilizer.  ____________________________________________   FINAL CLINICAL IMPRESSION(S) / ED DIAGNOSES  Final diagnoses:  Fall, initial encounter  Acute pain of right knee  Pain of right hip joint      NEW MEDICATIONS STARTED DURING THIS VISIT:  New Prescriptions   TRAMADOL (ULTRAM) 50 MG TABLET    Take 1 tablet (50 mg total) by mouth every 6 (six) hours as needed.  Note:  This document was prepared using Dragon voice recognition software and may include unintentional dictation errors.    Sable Feil, PA-C 12/18/15 1322    Lisa Roca, MD 12/22/15 1245

## 2015-12-18 NOTE — ED Triage Notes (Signed)
Pt arrived to ED reporting he tripped and fell. PT denies LOC. Pt reports right knee pain and left wrist pain. Pt in NAD at this time.

## 2016-01-22 ENCOUNTER — Other Ambulatory Visit: Payer: Self-pay | Admitting: Specialist

## 2016-01-22 DIAGNOSIS — M25561 Pain in right knee: Principal | ICD-10-CM

## 2016-01-22 DIAGNOSIS — M25461 Effusion, right knee: Secondary | ICD-10-CM

## 2016-01-30 ENCOUNTER — Ambulatory Visit: Admission: RE | Admit: 2016-01-30 | Payer: Federal, State, Local not specified - PPO | Source: Ambulatory Visit

## 2016-02-10 ENCOUNTER — Ambulatory Visit: Payer: Federal, State, Local not specified - PPO

## 2016-02-11 ENCOUNTER — Ambulatory Visit: Payer: Federal, State, Local not specified - PPO

## 2016-10-03 IMAGING — CT NM PET TUM IMG INITIAL (PI) SKULL BASE T - THIGH
1 of 10 series · 1 of 25 positions shown · non-contrast
Comparison: Chest CT from 01/21/2015.

CLINICAL DATA: Initial treatment strategy for right lung mass.

EXAM:
NUCLEAR MEDICINE PET SKULL BASE TO THIGH
TECHNIQUE: 13.0 mCi F-18 FDG was injected intravenously. Full-ring PET imaging
was performed from the skull base to thigh after the radiotracer. CT
data was obtained and used for attenuation correction and anatomic
localization.
FASTING BLOOD GLUCOSE:  Value: 116 mg/dl

[Series 3: ct wb 5.0 b30f · axial · 5.0mm · 0.98mm/px · 1 of 329 slices shown]
[im 329/329  brain]
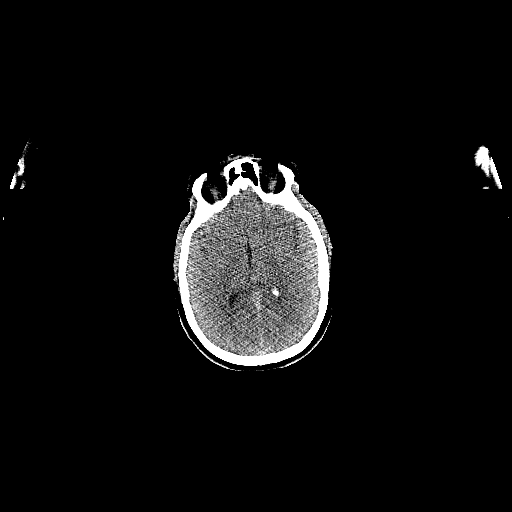

[1 of 25 positions shown; findings below may reference images not displayed]

FINDINGS: NECK

No hypermetabolic lymph nodes in the neck.

CHEST

No hypermetabolic mediastinal or hilar nodes. 19 mm posterior right
lower lobe pulmonary lesion is again noted. This shows very low
level FDG uptake, below blood pool activity with SUV max = 1.6. No
evidence for hypermetabolic lymphadenopathy in the mediastinum or
either hilum.

ABDOMEN/PELVIS

No abnormal hypermetabolic activity within the liver, pancreas,
adrenal glands, or spleen. No hypermetabolic lymph nodes in the
abdomen or pelvis.

A left paraumbilical hernia contains only omental fat. There is also
a smaller midline supraumbilical hernia containing omental fat.
Bilateral inguinal hernias contain only fat.

Patient is status post pelvic surgery with clips seen along both
pelvic sidewalls compatible with the history of prostate surgery.

SKELETON

No focal hypermetabolic activity to suggest skeletal metastasis.

Pars interarticularis defects are seen bilaterally at L5.
IMPRESSION: 19 mm posterior right lower lobe pulmonary nodule shows low level
FDG uptake. While not markedly hypermetabolic, well differentiated
or low-grade malignancy can be poorly FDG avid.

No evidence for hypermetabolic neoplastic disease elsewhere in the
chest, abdomen, or pelvis.

## 2018-01-24 ENCOUNTER — Ambulatory Visit (INDEPENDENT_AMBULATORY_CARE_PROVIDER_SITE_OTHER): Payer: Federal, State, Local not specified - PPO | Admitting: Psychiatry

## 2018-01-24 ENCOUNTER — Encounter: Payer: Self-pay | Admitting: Psychiatry

## 2018-01-24 DIAGNOSIS — C61 Malignant neoplasm of prostate: Secondary | ICD-10-CM | POA: Insufficient documentation

## 2018-01-24 DIAGNOSIS — G8929 Other chronic pain: Secondary | ICD-10-CM | POA: Insufficient documentation

## 2018-01-24 DIAGNOSIS — F40228 Other natural environment type phobia: Secondary | ICD-10-CM | POA: Diagnosis not present

## 2018-01-24 DIAGNOSIS — F422 Mixed obsessional thoughts and acts: Secondary | ICD-10-CM

## 2018-01-24 DIAGNOSIS — F4001 Agoraphobia with panic disorder: Secondary | ICD-10-CM | POA: Diagnosis not present

## 2018-01-24 DIAGNOSIS — G2581 Restless legs syndrome: Secondary | ICD-10-CM | POA: Insufficient documentation

## 2018-01-24 DIAGNOSIS — M549 Dorsalgia, unspecified: Secondary | ICD-10-CM

## 2018-01-24 DIAGNOSIS — F5105 Insomnia due to other mental disorder: Secondary | ICD-10-CM | POA: Diagnosis not present

## 2018-01-24 DIAGNOSIS — G4733 Obstructive sleep apnea (adult) (pediatric): Secondary | ICD-10-CM | POA: Insufficient documentation

## 2018-01-24 NOTE — Progress Notes (Signed)
Alejandro Cooley [id number] December 23, 1960 57 y.o.  Subjective:   Patient ID:  Alejandro Cooley is a 57 y.o. (DOB 1961/02/04) male.  Chief Complaint:  Chief Complaint  Patient presents with  . Anxiety    OCD  . Sleeping Problem    HPI Alejandro Cooley presents to the office today for follow-up of severe OCD and panic.  A few panic attacks.  Bored and health problems limit him.  Phobia of the night is chronic and worse with the time change.  Severe avoidance DT the OCD.  OCD waxes and wanes but is overall no worse than usual.  Limits him.  Others notice his symmetry compulsions.  Sleep overall is good , but not enough.  Problems with cancer causing him to have nocturia.  Grandson helps his depression and panic attacks and OCD bc he's too busy occupied with him.  57 year old.  OK with driving.  No swerving or difficulty with compulsions except reading signs.  Not too depressed.  Episodic flare ups with extreme anxiety.  Easily overwhelmed and doesn't handle stresss well.  Family protects him from this.  Prior psychiatric medication trials include sertraline, Luvox 300 mg, risperidone 3 mg, paroxetine 80 mg, clomipramine, off label gabapentin and N-acetylcysteine  Review of Systems:  Review of Systems  Constitutional:       Obese   Genitourinary:       Nocturia  Musculoskeletal: Positive for back pain and myalgias.  Neurological: Negative for tremors.  Psychiatric/Behavioral: Positive for behavioral problems and sleep disturbance. Negative for agitation, confusion, decreased concentration, dysphoric mood, hallucinations, self-injury and suicidal ideas. The patient is nervous/anxious. The patient is not hyperactive.     Medications: I have reviewed the patient's current medications.  Current Outpatient Medications  Medication Sig Dispense Refill  . albuterol (PROAIR HFA) 108 (90 BASE) MCG/ACT inhaler INHALE 2 PUFFS INTO THE LUNGS EVERY 4 HOURS AS NEEDED    . aspirin EC 81 MG tablet Take  by mouth.    Marland Kitchen atorvastatin (LIPITOR) 80 MG tablet TAKE 1 TABLET BY MOUTH EVERY DAY    . carvedilol (COREG) 25 MG tablet Take by mouth.    . clonazePAM (KLONOPIN) 1 MG tablet Take 1 mg by mouth 3 (three) times daily as needed.     . gabapentin (NEURONTIN) 300 MG capsule Take 600 mg by mouth 3 (three) times daily.     . montelukast (SINGULAIR) 10 MG tablet Take 1 tablet by mouth every day. Needs appt for more refills    . nitroGLYCERIN (NITROSTAT) 0.4 MG SL tablet Place under the tongue.    Marland Kitchen QUEtiapine (SEROQUEL XR) 400 MG 24 hr tablet Take 400 mg by mouth at bedtime.    . ramipril (ALTACE) 10 MG capsule Take by mouth.    . topiramate (TOPAMAX) 100 MG tablet Take 100 mg by mouth daily.    Marland Kitchen torsemide (DEMADEX) 20 MG tablet Take 20 mg by mouth 2 (two) times daily.  11  . traMADol (ULTRAM) 50 MG tablet Take by mouth.    . hydrALAZINE (APRESOLINE) 10 MG tablet Take by mouth.    . spironolactone (ALDACTONE) 25 MG tablet TAKE 1/2 TABLET(12.5 MG TOTAL) BY MOUTH ONCE DAILY.    Marland Kitchen tiotropium (SPIRIVA HANDIHALER) 18 MCG inhalation capsule Place into inhaler and inhale.    Marland Kitchen UNABLE TO FIND      No current facility-administered medications for this visit.     Medication Side Effects: None  Allergies: No Known Allergies  Past Medical History:  Diagnosis Date  . Cancer [facility])    prostate  . CHF (congestive heart failure) ([place])   . COPD (chronic obstructive pulmonary disease) ([place])   . DDD (degenerative disc disease), lumbar   . Depression   . Emphysema of lung ([place])   . Hypertension   . Sleep apnea     History reviewed. No pertinent family history.  Social History   Socioeconomic History  . Marital status: Married    Spouse name: Not on file  . Number of children: Not on file  . Years of education: Not on file  . Highest education level: Not on file  Occupational History  . Not on file  Social Needs  . Financial resource strain: Not on file  . Food insecurity:    Worry: Not on  file    Inability: Not on file  . Transportation needs:    Medical: Not on file    Non-medical: Not on file  Tobacco Use  . Smoking status: Current Every Day Smoker    Packs/day: 0.50    Types: Cigarettes  . Smokeless tobacco: Never Used  Substance and Sexual Activity  . Alcohol use: No  . Drug use: No  . Sexual activity: Not on file  Lifestyle  . Physical activity:    Days per week: Not on file    Minutes per session: Not on file  . Stress: Not on file  Relationships  . Social connections:    Talks on phone: Not on file    Gets together: Not on file    Attends religious service: Not on file    Active member of club or organization: Not on file    Attends meetings of clubs or organizations: Not on file    Relationship status: Not on file  . Intimate partner violence:    Fear of current or ex partner: Not on file    Emotionally abused: Not on file    Physically abused: Not on file    Forced sexual activity: Not on file  Other Topics Concern  . Not on file  Social History Narrative  . Not on file    Past Medical History, Surgical history, Social history, and Family history were reviewed and updated as appropriate.   Please see review of systems for further details on the patient's review from today.   Objective:   Physical Exam:  There were no vitals taken for this visit.  Physical Exam  Constitutional: He is oriented to person, place, and time. He appears well-developed. No distress.  Musculoskeletal: He exhibits no deformity.  Neurological: He is alert and oriented to person, place, and time. He displays no tremor. Coordination and gait normal.  Psychiatric: His speech is normal and behavior is normal. Judgment and thought content normal. His mood appears anxious. His affect is not angry, not blunt, not labile and not inappropriate. Thought content is not paranoid. Cognition and memory are normal. He does not exhibit a depressed mood. He expresses no homicidal and  no suicidal ideation. He expresses no suicidal plans and no homicidal plans.  Insight intact. No auditory or visual hallucinations. Chronic OCD including symmetry obsessions.    Lab Review:     Component Value Date/Time   NA 142 09/30/2014 1526   NA 143 12/29/2013 2139   K 4.5 09/30/2014 1526   K 3.8 12/29/2013 2139   CL 101 09/30/2014 1526   CL 105 12/29/2013 2139   CO2 33 (H) 09/30/2014 1526  CO2 31 12/29/2013 2139   GLUCOSE 88 09/30/2014 1526   GLUCOSE 112 (H) 12/29/2013 2139   BUN 20 09/30/2014 1526   BUN 14 12/29/2013 2139   CREATININE 0.99 09/30/2014 1526   CREATININE 0.78 12/29/2013 2139   CALCIUM 9.6 09/30/2014 1526   CALCIUM 8.3 (L) 12/29/2013 2139   PROT 7.4 09/30/2014 1526   PROT 7.2 08/30/2013 0542   ALBUMIN 4.0 09/30/2014 1526   ALBUMIN 3.5 08/30/2013 0542   AST 16 09/30/2014 1526   AST 20 08/30/2013 0542   ALT 17 09/30/2014 1526   ALT 29 08/30/2013 0542   ALKPHOS 77 09/30/2014 1526   ALKPHOS 93 08/30/2013 0542   BILITOT 0.8 09/30/2014 1526   BILITOT 0.5 08/30/2013 0542   GFRNONAA >60 09/30/2014 1526   GFRNONAA >60 12/29/2013 2139   GFRNONAA >60 08/30/2013 0542   GFRAA >60 09/30/2014 1526   GFRAA >60 12/29/2013 2139   GFRAA >60 08/30/2013 0542       Component Value Date/Time   WBC 7.8 09/30/2014 1526   RBC 5.13 09/30/2014 1526   HGB 16.4 09/30/2014 1526   HGB 15.6 12/29/2013 2139   HCT 48.3 09/30/2014 1526   HCT 47.0 12/29/2013 2139   PLT 143 (L) 09/30/2014 1526   PLT 167 12/29/2013 2139   MCV 94.1 09/30/2014 1526   MCV 94 12/29/2013 2139   MCH 31.9 09/30/2014 1526   MCHC 33.9 09/30/2014 1526   RDW 14.1 09/30/2014 1526   RDW 13.8 12/29/2013 2139   LYMPHSABS 1.8 09/30/2014 1526   LYMPHSABS 2.2 05/22/2011 1644   MONOABS 0.8 09/30/2014 1526   MONOABS 0.9 (H) 05/22/2011 1644   EOSABS 0.0 09/30/2014 1526   EOSABS 0.1 05/22/2011 1644   BASOSABS 0.0 09/30/2014 1526   BASOSABS 0.0 05/22/2011 1644    No results found for: POCLITH, LITHIUM    No results found for: PHENYTOIN, PHENOBARB, VALPROATE, CBMZ   .res Assessment: Plan:    Mixed obsessional thoughts and acts  Panic disorder with agoraphobia  Insomnia due to mental condition  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  Disc necessary polypharmacy and risks of that.  Also necessary for high dose SSRI and disc risk of serotonin syndrome.  High relapse risk without meds.  Hx severe OCD caused danger while driving bc of symmetry compulsions he would swerve back and forth on the road.  This no longer happens.   RLS is generally managed with the meds.  Discussed potential metabolic side effects associated with atypical antipsychotics, as well as potential risk for movement side effects. Advised pt to contact office if movement side effects occur.   This appt was 30 mins.  FU 6 mos  Alejandro Parents, MD, DFAPA   Please see After Visit Summary for patient specific instructions.  No future appointments.  No orders of the defined types were placed in this encounter.     -------------------------------

## 2018-04-07 ENCOUNTER — Encounter: Payer: Self-pay | Admitting: Emergency Medicine

## 2018-04-07 DIAGNOSIS — F410 Panic disorder [episodic paroxysmal anxiety] without agoraphobia: Secondary | ICD-10-CM | POA: Insufficient documentation

## 2018-04-07 DIAGNOSIS — F429 Obsessive-compulsive disorder, unspecified: Secondary | ICD-10-CM | POA: Insufficient documentation

## 2018-04-07 DIAGNOSIS — F40228 Other natural environment type phobia: Secondary | ICD-10-CM | POA: Insufficient documentation

## 2018-04-24 DEATH — deceased

## 2018-07-05 ENCOUNTER — Other Ambulatory Visit: Payer: Self-pay | Admitting: Psychiatry

## 2018-07-26 ENCOUNTER — Other Ambulatory Visit: Payer: Self-pay

## 2018-07-26 ENCOUNTER — Ambulatory Visit (INDEPENDENT_AMBULATORY_CARE_PROVIDER_SITE_OTHER): Payer: Federal, State, Local not specified - PPO | Admitting: Psychiatry

## 2018-07-26 ENCOUNTER — Encounter: Payer: Self-pay | Admitting: Psychiatry

## 2018-07-26 DIAGNOSIS — G2581 Restless legs syndrome: Secondary | ICD-10-CM

## 2018-07-26 DIAGNOSIS — F4001 Agoraphobia with panic disorder: Secondary | ICD-10-CM

## 2018-07-26 DIAGNOSIS — F40228 Other natural environment type phobia: Secondary | ICD-10-CM | POA: Diagnosis not present

## 2018-07-26 DIAGNOSIS — F5105 Insomnia due to other mental disorder: Secondary | ICD-10-CM

## 2018-07-26 DIAGNOSIS — F422 Mixed obsessional thoughts and acts: Secondary | ICD-10-CM

## 2018-07-26 MED ORDER — QUETIAPINE FUMARATE ER 400 MG PO TB24: 400.0000 mg | ORAL_TABLET | Freq: Every day | ORAL | 1 refills | Status: AC

## 2018-07-26 MED ORDER — CLONAZEPAM 1 MG PO TABS: ORAL_TABLET | ORAL | 1 refills | Status: DC

## 2018-07-26 MED ORDER — GABAPENTIN 300 MG PO CAPS: 600.0000 mg | ORAL_CAPSULE | Freq: Three times a day (TID) | ORAL | 1 refills | Status: AC

## 2018-07-26 MED ORDER — PAROXETINE HCL 40 MG PO TABS: 40.0000 mg | ORAL_TABLET | Freq: Every day | ORAL | 1 refills | Status: DC

## 2018-07-26 NOTE — Progress Notes (Signed)
Alejandro Cooley [id number] Jul 31, 1960 58 y.o.   Virtual Visit via Telephone Note  I connected with pt by telephone and verified that I am speaking with the correct person using two identifiers.   I discussed the limitations, risks, security and privacy concerns of performing an evaluation and management service by telephone and the availability of in person appointments. I also discussed with the patient that there may be a patient responsible charge related to this service. The patient expressed understanding and agreed to proceed.  I discussed the assessment and treatment plan with the patient. The patient was provided an opportunity to ask questions and all were answered. The patient agreed with the plan and demonstrated an understanding of the instructions.   The patient was advised to call back or seek an in-person evaluation if the symptoms worsen or if the condition fails to improve as anticipated.  I provided 30 minutes of non-face-to-face time during this encounter. The call started at 1135 and ended at 1205. The patient was located at home and the provider was located office.   Subjective:   Patient ID:  Alejandro Cooley is a 58 y.o. (DOB [birth year]) male.  Chief Complaint:  Chief Complaint  Patient presents with  . Follow-up    Medication Management  . Anxiety    Medication Management    Anxiety  Symptoms include nervous/anxious behavior. Patient reports no confusion, decreased concentration or suicidal ideas.     Alejandro Cooley presents to the office today for follow-up of severe OCD and panic.   Last seen December 2.  No meds were changed.  Real good overall.  Has resumed some work where he doesn't have to be around people.  Struggled with cooped up.  Helped to get out and seeing people.  Covid freaked him for awhile.  Has not worked in years.  Working  Less than 4 hours a day just when he can.  Still having chemo and only works when he can.  Can stay home if he  doesn't feel like getting out.  Gets panicky if he is obligated to do something at a certain time.   A few panic attacks.  Bored and health problems limit him.  Phobia of the night is chronic and worse with the time change.  Severe avoidance DT the OCD.  OCD waxes and wanes but is overall no worse than usual.  Limits him.  Others notice his symmetry compulsions.  Sleep overall is good and better, but not enough.  Was having RLS but gabapentin earlier in the evening has helped.  Problems with cancer causing him to have nocturia.  Grandson helps his depression and panic attacks and OCD bc he's too busy occupied with him.  58 year old.  Work helped his mood.  OK with driving but only to work and back.  No swerving or difficulty with compulsions except reading signs.  If it flares he doesn't drive.  Not too depressed.  Episodic flare ups with extreme anxiety.  Easily overwhelmed and doesn't handle stresss well.  Family protects him from this.  Prior psychiatric medication trials include sertraline, Luvox 300 mg, risperidone 3 mg, paroxetine 80 mg, clomipramine, off label gabapentin and N-acetylcysteine  Review of Systems:  Review of Systems  Constitutional:       Obese   Genitourinary:       Nocturia  Musculoskeletal: Positive for back pain and myalgias.  Neurological: Negative for tremors and weakness.  Psychiatric/Behavioral: Positive for behavioral problems. Negative for  agitation, confusion, decreased concentration, dysphoric mood, hallucinations, self-injury, sleep disturbance and suicidal ideas. The patient is nervous/anxious. The patient is not hyperactive.     Medications: I have reviewed the patient's current medications.  Current Outpatient Medications  Medication Sig Dispense Refill  . albuterol (PROAIR HFA) 108 (90 BASE) MCG/ACT inhaler INHALE 2 PUFFS INTO THE LUNGS EVERY 4 HOURS AS NEEDED    . albuterol (PROVENTIL) (2.5 MG/3ML) 0.083% nebulizer solution TAKE 3 MLS (2.5 MG TOTAL) BY  NEBULIZATION EVERY 6 (SIX) HOURS AS NEEDED FOR WHEEZING    . aspirin EC 81 MG tablet Take by mouth.    Marland Kitchen atorvastatin (LIPITOR) 80 MG tablet TAKE 1 TABLET BY MOUTH EVERY DAY    . carvedilol (COREG) 25 MG tablet Take by mouth.    . clonazePAM (KLONOPIN) 1 MG tablet TAKE 1 TABLET BY MOUTH TWICE A DAY AND TAKE 1 TABLET EVERY DAY AS NEEDED FOR ANXIETY 270 tablet 1  . fluticasone (FLONASE) 50 MCG/ACT nasal spray SPRAY 2 SPRAYS INTO EACH NOSTRIL EVERY DAY    . gabapentin (NEURONTIN) 300 MG capsule Take 2 capsules (600 mg total) by mouth 3 (three) times daily. 270 capsule 1  . meclizine (ANTIVERT) 25 MG tablet TAKE 1 TABLET (25 MG TOTAL) BY MOUTH 3 (THREE) TIMES DAILY AS NEEDED FOR DIZZINESS FOR UP TO 10 DAYS    . montelukast (SINGULAIR) 10 MG tablet Take 1 tablet by mouth every day. Needs appt for more refills    . NARCAN 4 MG/0.1ML LIQD nasal spray kit USE 1 SPRAY (4 MG TOTAL) BY INTRANASAL ROUTE ONCE AS NEEDED FOR UP TO 1 DOSE    . nitroGLYCERIN (NITROSTAT) 0.4 MG SL tablet Place under the tongue.    Marland Kitchen PARoxetine (PAXIL) 40 MG tablet Take 1 tablet (40 mg total) by mouth daily. 90 tablet 1  . QUEtiapine (SEROQUEL XR) 400 MG 24 hr tablet Take 1 tablet (400 mg total) by mouth at bedtime. 90 tablet 1  . ramipril (ALTACE) 10 MG capsule Take by mouth.    . spironolactone (ALDACTONE) 25 MG tablet Take by mouth.    . torsemide (DEMADEX) 20 MG tablet Take 20 mg by mouth 2 (two) times daily.  11  . traMADol (ULTRAM) 50 MG tablet Take 100 mg by mouth 3 (three) times daily.     . TRELEGY ELLIPTA 100-62.5-25 MCG/INH AEPB INHALE 1 PUFF INTO THE LUNGS ONCE DAILY    . vitamin B-12 (CYANOCOBALAMIN) 1000 MCG tablet Take by mouth.     No current facility-administered medications for this visit.     Medication Side Effects: None  Allergies: No Known Allergies  Past Medical History:  Diagnosis Date  . Cancer [facility])    prostate  . CHF (congestive heart failure) ([place])   . COPD (chronic obstructive pulmonary  disease) ([place])   . DDD (degenerative disc disease), lumbar   . Depression   . Emphysema of lung ([place])   . Hypertension   . Sleep apnea     History reviewed. No pertinent family history.  Social History   Socioeconomic History  . Marital status: Married    Spouse name: Not on file  . Number of children: Not on file  . Years of education: Not on file  . Highest education level: Not on file  Occupational History  . Not on file  Social Needs  . Financial resource strain: Not on file  . Food insecurity:    Worry: Not on file    Inability: Not on  file  . Transportation needs:    Medical: Not on file    Non-medical: Not on file  Tobacco Use  . Smoking status: Current Every Day Smoker    Packs/day: 0.50    Types: Cigarettes  . Smokeless tobacco: Never Used  Substance and Sexual Activity  . Alcohol use: No  . Drug use: No  . Sexual activity: Not on file  Lifestyle  . Physical activity:    Days per week: Not on file    Minutes per session: Not on file  . Stress: Not on file  Relationships  . Social connections:    Talks on phone: Not on file    Gets together: Not on file    Attends religious service: Not on file    Active member of club or organization: Not on file    Attends meetings of clubs or organizations: Not on file    Relationship status: Not on file  . Intimate partner violence:    Fear of current or ex partner: Not on file    Emotionally abused: Not on file    Physically abused: Not on file    Forced sexual activity: Not on file  Other Topics Concern  . Not on file  Social History Narrative  . Not on file    Past Medical History, Surgical history, Social history, and Family history were reviewed and updated as appropriate.   Please see review of systems for further details on the patient's review from today.   Objective:   Physical Exam:  There were no vitals taken for this visit.  Physical Exam Neurological:     Mental Status: He is alert and  oriented to person, place, and time.     Cranial Nerves: No dysarthria.  Psychiatric:        Attention and Perception: Attention normal.        Mood and Affect: Mood is anxious. Mood is not depressed.        Speech: Speech normal.        Behavior: Behavior is cooperative.        Thought Content: Thought content is not paranoid or delusional. Thought content does not include homicidal or suicidal ideation. Thought content does not include homicidal or suicidal plan.        Cognition and Memory: Cognition and memory normal.        Judgment: Judgment normal.     Comments: He still has very significant OCD but is managing it pretty well.  He is recently more functional than usual.     Lab Review:     Component Value Date/Time   NA 142 09/30/2014 1526   NA 143 12/29/2013 2139   K 4.5 09/30/2014 1526   K 3.8 12/29/2013 2139   CL 101 09/30/2014 1526   CL 105 12/29/2013 2139   CO2 33 (H) 09/30/2014 1526   CO2 31 12/29/2013 2139   GLUCOSE 88 09/30/2014 1526   GLUCOSE 112 (H) 12/29/2013 2139   BUN 20 09/30/2014 1526   BUN 14 12/29/2013 2139   CREATININE 0.99 09/30/2014 1526   CREATININE 0.78 12/29/2013 2139   CALCIUM 9.6 09/30/2014 1526   CALCIUM 8.3 (L) 12/29/2013 2139   PROT 7.4 09/30/2014 1526   PROT 7.2 08/30/2013 0542   ALBUMIN 4.0 09/30/2014 1526   ALBUMIN 3.5 08/30/2013 0542   AST 16 09/30/2014 1526   AST 20 08/30/2013 0542   ALT 17 09/30/2014 1526   ALT 29 08/30/2013 0542   ALKPHOS  77 09/30/2014 1526   ALKPHOS 93 08/30/2013 0542   BILITOT 0.8 09/30/2014 1526   BILITOT 0.5 08/30/2013 0542   GFRNONAA >60 09/30/2014 1526   GFRNONAA >60 12/29/2013 2139   GFRNONAA >60 08/30/2013 0542   GFRAA >60 09/30/2014 1526   GFRAA >60 12/29/2013 2139   GFRAA >60 08/30/2013 0542       Component Value Date/Time   WBC 7.8 09/30/2014 1526   RBC 5.13 09/30/2014 1526   HGB 16.4 09/30/2014 1526   HGB 15.6 12/29/2013 2139   HCT 48.3 09/30/2014 1526   HCT 47.0 12/29/2013 2139   PLT  143 (L) 09/30/2014 1526   PLT 167 12/29/2013 2139   MCV 94.1 09/30/2014 1526   MCV 94 12/29/2013 2139   MCH 31.9 09/30/2014 1526   MCHC 33.9 09/30/2014 1526   RDW 14.1 09/30/2014 1526   RDW 13.8 12/29/2013 2139   LYMPHSABS 1.8 09/30/2014 1526   LYMPHSABS 2.2 05/22/2011 1644   MONOABS 0.8 09/30/2014 1526   MONOABS 0.9 (H) 05/22/2011 1644   EOSABS 0.0 09/30/2014 1526   EOSABS 0.1 05/22/2011 1644   BASOSABS 0.0 09/30/2014 1526   BASOSABS 0.0 05/22/2011 1644    No results found for: POCLITH, LITHIUM   No results found for: PHENYTOIN, PHENOBARB, VALPROATE, CBMZ   .res Assessment: Plan:    Mixed obsessional thoughts and acts - Plan: QUEtiapine (SEROQUEL XR) 400 MG 24 hr tablet, PARoxetine (PAXIL) 40 MG tablet  Panic disorder with agoraphobia - Plan: clonazePAM (KLONOPIN) 1 MG tablet, gabapentin (NEURONTIN) 300 MG capsule  Insomnia due to mental condition  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  His OCD at times has been very dangerous as it would affect his driving.  He has responded well to the medication.  A few months ago he reduced the paroxetine from 80 mg down to 40 mg a day.  He feels like he has less side effects and his OCD is no worse.  The Seroquel XR is also used and is helpful for his OCD and depression.  His compulsions are not dangerous at this time.  He still lives with a considerable amount of OCD and is in fact disabled from it but has been able to resume a few hours a week of work in a situation where he can stay home if he does not feel like doing it.  That is about the only type of work he could do.  He is less depressed as a result of being able to get out and function better.  Panic attacks are generally under control except when provoked.  No indication for med change.  Disc necessary polypharmacy and risks of that.    High relapse risk without meds.  Hx severe OCD caused danger while driving bc of symmetry compulsions he would swerve back and forth on the road.  This no longer happens.     RLS is generally managed with the meds.  And his insomnia is better.  Discussed potential metabolic side effects associated with atypical antipsychotics, as well as potential risk for movement side effects. Advised pt to contact office if movement side effects occur.   This appt was 30 mins.  FU 6 mos  Lynder Parents, MD, DFAPA   Please see After Visit Summary for patient specific instructions.  No future appointments.  No orders of the defined  types were placed in this encounter.     -------------------------------

## 2018-12-15 ENCOUNTER — Other Ambulatory Visit: Payer: Self-pay | Admitting: Family Medicine

## 2018-12-15 ENCOUNTER — Other Ambulatory Visit (HOSPITAL_COMMUNITY): Payer: Self-pay | Admitting: Family Medicine

## 2018-12-16 ENCOUNTER — Other Ambulatory Visit: Payer: Self-pay | Admitting: Family Medicine

## 2018-12-16 DIAGNOSIS — R413 Other amnesia: Secondary | ICD-10-CM

## 2018-12-19 ENCOUNTER — Other Ambulatory Visit: Payer: Self-pay

## 2018-12-19 ENCOUNTER — Ambulatory Visit
Admission: RE | Admit: 2018-12-19 | Discharge: 2018-12-19 | Disposition: A | Discharge status: Home / Self Care | Payer: Federal, State, Local not specified - PPO | Source: Ambulatory Visit | Attending: Family Medicine | Admitting: Family Medicine

## 2018-12-19 DIAGNOSIS — R413 Other amnesia: Secondary | ICD-10-CM | POA: Diagnosis not present

## 2018-12-19 MED ORDER — GADOBUTROL 1 MMOL/ML IV SOLN
10.0000 mL | Freq: Once | INTRAVENOUS | Status: AC | PRN
  Administered 2018-12-19: 10 mL via INTRAVENOUS

## 2018-12-28 ENCOUNTER — Ambulatory Visit: Payer: Federal, State, Local not specified - PPO

## 2019-01-31 ENCOUNTER — Other Ambulatory Visit: Payer: Self-pay

## 2019-01-31 ENCOUNTER — Ambulatory Visit (INDEPENDENT_AMBULATORY_CARE_PROVIDER_SITE_OTHER): Payer: Federal, State, Local not specified - PPO | Admitting: Psychiatry

## 2019-01-31 ENCOUNTER — Encounter: Payer: Self-pay | Admitting: Psychiatry

## 2019-01-31 DIAGNOSIS — F422 Mixed obsessional thoughts and acts: Secondary | ICD-10-CM

## 2019-01-31 DIAGNOSIS — F5105 Insomnia due to other mental disorder: Secondary | ICD-10-CM | POA: Diagnosis not present

## 2019-01-31 DIAGNOSIS — F40228 Other natural environment type phobia: Secondary | ICD-10-CM | POA: Diagnosis not present

## 2019-01-31 DIAGNOSIS — F4001 Agoraphobia with panic disorder: Secondary | ICD-10-CM

## 2019-01-31 DIAGNOSIS — G2581 Restless legs syndrome: Secondary | ICD-10-CM

## 2019-01-31 MED ORDER — CLONAZEPAM 1 MG PO TABS: ORAL_TABLET | ORAL | 1 refills | Status: AC

## 2019-01-31 NOTE — Progress Notes (Signed)
[name] [id number] [birth year] 58 y.o.   Virtual Visit via [place]  I connected with pt by WebEx and verified that I am speaking with the correct person using two identifiers.   I discussed the limitations, risks, security and privacy concerns of performing an evaluation and management service by [name] and the availability of in person appointments. I also discussed with the patient that there may be a patient responsible charge related to this service. The patient expressed understanding and agreed to proceed.  I discussed the assessment and treatment plan with the patient. The patient was provided an opportunity to ask questions and all were answered. The patient agreed with the plan and demonstrated an understanding of the instructions.   The patient was advised to call back or seek an in-person evaluation if the symptoms worsen or if the condition fails to improve as anticipated.  I provided 30 minutes of video time during this encounter. The call started at 300 and ended at 3:30. The patient was located at home and the provider was located office.    Subjective:   Patient ID:  Alejandro Cooley is a 58 y.o. (DOB 1960-09-20) male.  Chief Complaint:  Chief Complaint  Patient presents with  . Anxiety    med management  . Depression  . OCD    Anxiety Symptoms include nervous/anxious behavior. Patient reports no confusion, decreased concentration or suicidal ideas.     Alejandro Cooley presents to the office today for follow-up of very severe OCD and panic.   Last seen June 2020.  No meds were changed.  In 2019 he had reduced paroxetine from 80 mg a day to 40 mg daily.  He felt like his OCD had remained manageable though it has never resolved.  Challenging health in family.  Everyone got Covid except for him.  Everyone recovered.  RTW next Monday.  Moved back home this week.  Now things back to normal as possible.  Confinement is not good for his anxiety.  Rides golf cart  to [employer] nearby and takes GS to church playground.  Tries to keep him mind busy.  GS 57 yo.  Fall has worsening sx temporarily of depression but used to it now.    Real good overall.  Has resumed some work where he doesn't have to be around people.  Struggled with cooped up.  Helped to get out and seeing people.  Covid freaked him for awhile.  Has not worked in years.  Working  Less than 4 hours a day just when he can.  Still having chemo and only works when he can.  Can stay home if he doesn't feel like getting out.  Gets panicky if he is obligated to do something at a certain time.   A few panic attacks.  Bored and health problems limit him.  Phobia of the night is chronic and worse with the time change.  Severe avoidance DT the OCD.  OCD waxes and wanes but is overall no worse than usual.  Limits him.  Others notice his symmetry compulsions.  Sleep overall is good and better, but not enough.   Was having RLS but gabapentin earlier in the evening has helped.  Problems with cancer causing him to have nocturia.  Grandson helps his depression and panic attacks and OCD bc he's too busy occupied with him.  57 year old.  Work helped his mood.  OK with driving but only to work and back.  No swerving or difficulty  with compulsions except reading signs.  If it flares he doesn't drive.  Not too depressed.  Episodic flare ups with extreme anxiety.  Easily overwhelmed and doesn't handle stresss well.  Family protects him from this.  D nurse has OCD though less severe and visits daily.  Prior psychiatric medication trials include sertraline, Luvox 300 mg, risperidone 3 mg, paroxetine 80 mg, clomipramine, off label gabapentin and N-acetylcysteine  Review of Systems:  Review of Systems  Constitutional:       Obese   Genitourinary:       Nocturia  Musculoskeletal: Positive for back pain and myalgias.  Skin: Positive for rash.  Neurological: Negative for tremors and weakness.  Psychiatric/Behavioral: Positive  for behavioral problems. Negative for agitation, confusion, decreased concentration, dysphoric mood, hallucinations, self-injury, sleep disturbance and suicidal ideas. The patient is nervous/anxious. The patient is not hyperactive.     Medications: I have reviewed the patient's current medications.  Current Outpatient Medications  Medication Sig Dispense Refill  . albuterol (PROAIR HFA) 108 (90 BASE) MCG/ACT inhaler INHALE 2 PUFFS INTO THE LUNGS EVERY 4 HOURS AS NEEDED    . albuterol (PROVENTIL) (2.5 MG/3ML) 0.083% nebulizer solution TAKE 3 MLS (2.5 MG TOTAL) BY NEBULIZATION EVERY 6 (SIX) HOURS AS NEEDED FOR WHEEZING    . aspirin EC 81 MG tablet Take by mouth.    Marland Kitchen atorvastatin (LIPITOR) 80 MG tablet TAKE 1 TABLET BY MOUTH EVERY DAY    . carvedilol (COREG) 25 MG tablet Take by mouth.    . clonazePAM (KLONOPIN) 1 MG tablet TAKE 1 TABLET BY MOUTH TWICE A DAY AND TAKE 1 TABLET EVERY DAY AS NEEDED FOR ANXIETY 270 tablet 1  . fluticasone (FLONASE) 50 MCG/ACT nasal spray SPRAY 2 SPRAYS INTO EACH NOSTRIL EVERY DAY    . gabapentin (NEURONTIN) 300 MG capsule Take 2 capsules (600 mg total) by mouth 3 (three) times daily. 270 capsule 1  . meclizine (ANTIVERT) 25 MG tablet TAKE 1 TABLET (25 MG TOTAL) BY MOUTH 3 (THREE) TIMES DAILY AS NEEDED FOR DIZZINESS FOR UP TO 10 DAYS    . montelukast (SINGULAIR) 10 MG tablet Take 1 tablet by mouth every day. Needs appt for more refills    . NARCAN 4 MG/0.1ML LIQD nasal spray kit USE 1 SPRAY (4 MG TOTAL) BY INTRANASAL ROUTE ONCE AS NEEDED FOR UP TO 1 DOSE    . nitroGLYCERIN (NITROSTAT) 0.4 MG SL tablet Place under the tongue.    Marland Kitchen PARoxetine (PAXIL) 40 MG tablet Take 1 tablet (40 mg total) by mouth daily. 90 tablet 1  . QUEtiapine (SEROQUEL XR) 400 MG 24 hr tablet Take 1 tablet (400 mg total) by mouth at bedtime. 90 tablet 1  . ramipril (ALTACE) 10 MG capsule Take by mouth.    . torsemide (DEMADEX) 20 MG tablet Take 20 mg by mouth 2 (two) times daily.  11  . traMADol  (ULTRAM) 50 MG tablet Take 100 mg by mouth 3 (three) times daily.     . TRELEGY ELLIPTA 100-62.5-25 MCG/INH AEPB INHALE 1 PUFF INTO THE LUNGS ONCE DAILY    . vitamin B-12 (CYANOCOBALAMIN) 1000 MCG tablet Take by mouth.     No current facility-administered medications for this visit.     Medication Side Effects: None  Allergies: No Known Allergies  Past Medical History:  Diagnosis Date  . Cancer [facility])    prostate  . CHF (congestive heart failure) ([place])   . COPD (chronic obstructive pulmonary disease) ([place])   . DDD (degenerative  disc disease), lumbar   . Depression   . Emphysema of lung ([place])   . Hypertension   . Sleep apnea     History reviewed. No pertinent family history.  Social History   Socioeconomic History  . Marital status: Married    Spouse name: Not on file  . Number of children: Not on file  . Years of education: Not on file  . Highest education level: Not on file  Occupational History  . Not on file  Social Needs  . Financial resource strain: Not on file  . Food insecurity    Worry: Not on file    Inability: Not on file  . Transportation needs    Medical: Not on file    Non-medical: Not on file  Tobacco Use  . Smoking status: Current Every Day Smoker    Packs/day: 0.50    Types: Cigarettes  . Smokeless tobacco: Never Used  Substance and Sexual Activity  . Alcohol use: No  . Drug use: No  . Sexual activity: Not on file  Lifestyle  . Physical activity    Days per week: Not on file    Minutes per session: Not on file  . Stress: Not on file  Relationships  . Social Herbalist on phone: Not on file    Gets together: Not on file    Attends religious service: Not on file    Active member of club or organization: Not on file    Attends meetings of clubs or organizations: Not on file    Relationship status: Not on file  . Intimate partner violence    Fear of current or ex partner: Not on file    Emotionally abused: Not on file     Physically abused: Not on file    Forced sexual activity: Not on file  Other Topics Concern  . Not on file  Social History Narrative  . Not on file    Past Medical History, Surgical history, Social history, and Family history were reviewed and updated as appropriate.   Please see review of systems for further details on the patient's review from today.   Objective:   Physical Exam:  There were no vitals taken for this visit.  Physical Exam Neurological:     Mental Status: He is alert and oriented to person, place, and time.     Cranial Nerves: No dysarthria.  Psychiatric:        Attention and Perception: Attention normal.        Mood and Affect: Mood is anxious. Mood is not depressed.        Speech: Speech normal.        Behavior: Behavior is cooperative.        Thought Content: Thought content is not paranoid or delusional. Thought content does not include homicidal or suicidal ideation. Thought content does not include homicidal or suicidal plan.        Cognition and Memory: Cognition and memory normal.        Judgment: Judgment normal.     Comments: He still has very significant OCD but is managing it pretty well.  He is recently more functional than usual. No sign change in sx.     Lab Review:     Component Value Date/Time   NA 142 09/30/2014 1526   NA 143 12/29/2013 2139   K 4.5 09/30/2014 1526   K 3.8 12/29/2013 2139   CL 101 09/30/2014 1526  CL 105 12/29/2013 2139   CO2 33 (H) 09/30/2014 1526   CO2 31 12/29/2013 2139   GLUCOSE 88 09/30/2014 1526   GLUCOSE 112 (H) 12/29/2013 2139   BUN 20 09/30/2014 1526   BUN 14 12/29/2013 2139   CREATININE 0.99 09/30/2014 1526   CREATININE 0.78 12/29/2013 2139   CALCIUM 9.6 09/30/2014 1526   CALCIUM 8.3 (L) 12/29/2013 2139   PROT 7.4 09/30/2014 1526   PROT 7.2 08/30/2013 0542   ALBUMIN 4.0 09/30/2014 1526   ALBUMIN 3.5 08/30/2013 0542   AST 16 09/30/2014 1526   AST 20 08/30/2013 0542   ALT 17 09/30/2014 1526   ALT  29 08/30/2013 0542   ALKPHOS 77 09/30/2014 1526   ALKPHOS 93 08/30/2013 0542   BILITOT 0.8 09/30/2014 1526   BILITOT 0.5 08/30/2013 0542   GFRNONAA >60 09/30/2014 1526   GFRNONAA >60 12/29/2013 2139   GFRNONAA >60 08/30/2013 0542   GFRAA >60 09/30/2014 1526   GFRAA >60 12/29/2013 2139   GFRAA >60 08/30/2013 0542       Component Value Date/Time   WBC 7.8 09/30/2014 1526   RBC 5.13 09/30/2014 1526   HGB 16.4 09/30/2014 1526   HGB 15.6 12/29/2013 2139   HCT 48.3 09/30/2014 1526   HCT 47.0 12/29/2013 2139   PLT 143 (L) 09/30/2014 1526   PLT 167 12/29/2013 2139   MCV 94.1 09/30/2014 1526   MCV 94 12/29/2013 2139   MCH 31.9 09/30/2014 1526   MCHC 33.9 09/30/2014 1526   RDW 14.1 09/30/2014 1526   RDW 13.8 12/29/2013 2139   LYMPHSABS 1.8 09/30/2014 1526   LYMPHSABS 2.2 05/22/2011 1644   MONOABS 0.8 09/30/2014 1526   MONOABS 0.9 (H) 05/22/2011 1644   EOSABS 0.0 09/30/2014 1526   EOSABS 0.1 05/22/2011 1644   BASOSABS 0.0 09/30/2014 1526   BASOSABS 0.0 05/22/2011 1644    No results found for: POCLITH, LITHIUM   No results found for: PHENYTOIN, PHENOBARB, VALPROATE, CBMZ   .res Assessment: Plan:    Mixed obsessional thoughts and acts  Panic disorder with agoraphobia  Insomnia due to mental condition  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  His OCD at times has been very dangerous as it would affect his driving.  He has responded well to the medication.  A few months ago he reduced the paroxetine from 80 mg down to 40 mg a day.  He feels like he has less side effects and his OCD is no worse.  The Seroquel XR is also used and is helpful for his OCD and depression.  His compulsions are not dangerous at this time.  He still lives with a considerable amount of OCD and is in fact disabled from it but has been able to resume a few hours a week of work in a situation where he can stay  home if he does not feel like doing it.  That is about the only type of work he could do.  He is less depressed as a result of being able to get out and function better.  Panic attacks are generally under control except when provoked.  No indication for med change.   Continue paroxetine 40 mg daily Seroquel XR 400 mg pm Clonazepam 1 mg 1 TID prn anxiety  Disc necessary polypharmacy and risks of that.   High relapse risk without meds.  Hx severe OCD caused danger  while driving bc of symmetry compulsions he would swerve back and forth on the road.  This no longer happens.     RLS is generally managed with the meds.  And his insomnia is better.  Discussed potential metabolic side effects associated with atypical antipsychotics, as well as potential risk for movement side effects. Advised pt to contact office if movement side effects occur.   This appt was 30 mins.  FU 6 mos  Lynder Parents, MD, DFAPA   Please see After Visit Summary for patient specific instructions.  No future appointments.  No orders of the defined types were placed in this encounter.     -------------------------------

## 2019-03-21 ENCOUNTER — Other Ambulatory Visit: Payer: Self-pay | Admitting: Psychiatry

## 2019-03-21 DIAGNOSIS — F422 Mixed obsessional thoughts and acts: Secondary | ICD-10-CM

## 2019-04-24 DEATH — deceased
